# Patient Record
Sex: Female | Born: 1990
Health system: Southern US, Community
[De-identification: ages and names within clinical notes are randomized; demographics above are authoritative.]

## PROBLEM LIST (undated history)

## (undated) ENCOUNTER — Inpatient Hospital Stay (HOSPITAL_COMMUNITY): Payer: 59

## (undated) DIAGNOSIS — F329 Major depressive disorder, single episode, unspecified: Secondary | ICD-10-CM

## (undated) DIAGNOSIS — G47419 Narcolepsy without cataplexy: Secondary | ICD-10-CM

## (undated) DIAGNOSIS — J45909 Unspecified asthma, uncomplicated: Secondary | ICD-10-CM

## (undated) DIAGNOSIS — G4733 Obstructive sleep apnea (adult) (pediatric): Secondary | ICD-10-CM

## (undated) DIAGNOSIS — C73 Malignant neoplasm of thyroid gland: Secondary | ICD-10-CM

## (undated) DIAGNOSIS — F319 Bipolar disorder, unspecified: Secondary | ICD-10-CM

## (undated) DIAGNOSIS — F32A Depression, unspecified: Secondary | ICD-10-CM

## (undated) DIAGNOSIS — G4710 Hypersomnia, unspecified: Secondary | ICD-10-CM

## (undated) DIAGNOSIS — F419 Anxiety disorder, unspecified: Secondary | ICD-10-CM

## (undated) DIAGNOSIS — J302 Other seasonal allergic rhinitis: Secondary | ICD-10-CM

## (undated) HISTORY — DX: Narcolepsy without cataplexy: G47.419

## (undated) HISTORY — PX: THYROID SURGERY: SHX805

## (undated) HISTORY — DX: Unspecified asthma, uncomplicated: J45.909

## (undated) HISTORY — DX: Malignant neoplasm of thyroid gland: C73

## (undated) HISTORY — DX: Hypersomnia, unspecified: G47.10

## (undated) HISTORY — DX: Bipolar disorder, unspecified: F31.9

## (undated) HISTORY — DX: Obstructive sleep apnea (adult) (pediatric): G47.33

## (undated) HISTORY — DX: Other seasonal allergic rhinitis: J30.2

---
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
## 2008-04-28 ENCOUNTER — Emergency Department (HOSPITAL_COMMUNITY): Admission: EM | Admit: 2008-04-28 | Discharge: 2008-04-28 | Payer: Self-pay | Admitting: Emergency Medicine

## 2009-05-23 ENCOUNTER — Encounter: Admission: RE | Admit: 2009-05-23 | Discharge: 2009-05-23 | Payer: Self-pay | Admitting: Family Medicine

## 2009-11-16 ENCOUNTER — Encounter: Admission: RE | Admit: 2009-11-16 | Discharge: 2009-11-16 | Payer: Self-pay

## 2009-12-01 HISTORY — PX: BREAST SURGERY: SHX581

## 2009-12-14 ENCOUNTER — Ambulatory Visit (HOSPITAL_BASED_OUTPATIENT_CLINIC_OR_DEPARTMENT_OTHER): Admission: RE | Admit: 2009-12-14 | Discharge: 2009-12-14 | Payer: Self-pay | Admitting: Surgery

## 2010-11-16 ENCOUNTER — Inpatient Hospital Stay (INDEPENDENT_AMBULATORY_CARE_PROVIDER_SITE_OTHER)
Admission: RE | Admit: 2010-11-16 | Discharge: 2010-11-16 | Disposition: A | Discharge status: Home / Self Care | Payer: 59 | Source: Ambulatory Visit | Attending: Family Medicine | Admitting: Family Medicine

## 2010-11-16 DIAGNOSIS — J45909 Unspecified asthma, uncomplicated: Secondary | ICD-10-CM

## 2012-03-03 DIAGNOSIS — E890 Postprocedural hypothyroidism: Secondary | ICD-10-CM | POA: Insufficient documentation

## 2012-09-27 ENCOUNTER — Other Ambulatory Visit (HOSPITAL_COMMUNITY)
Admission: RE | Admit: 2012-09-27 | Discharge: 2012-09-27 | Disposition: A | Discharge status: Home / Self Care | Payer: 59 | Source: Ambulatory Visit | Attending: Family Medicine | Admitting: Family Medicine

## 2012-09-27 ENCOUNTER — Other Ambulatory Visit: Payer: Self-pay | Admitting: Family Medicine

## 2012-09-27 DIAGNOSIS — Z124 Encounter for screening for malignant neoplasm of cervix: Secondary | ICD-10-CM | POA: Insufficient documentation

## 2012-09-27 DIAGNOSIS — Z1151 Encounter for screening for human papillomavirus (HPV): Secondary | ICD-10-CM | POA: Insufficient documentation

## 2012-09-27 DIAGNOSIS — Z113 Encounter for screening for infections with a predominantly sexual mode of transmission: Secondary | ICD-10-CM | POA: Insufficient documentation

## 2012-09-28 ENCOUNTER — Other Ambulatory Visit: Payer: Self-pay | Admitting: Family Medicine

## 2012-09-28 DIAGNOSIS — E049 Nontoxic goiter, unspecified: Secondary | ICD-10-CM

## 2012-09-29 ENCOUNTER — Other Ambulatory Visit: Payer: 59

## 2012-09-30 ENCOUNTER — Ambulatory Visit
Admission: RE | Admit: 2012-09-30 | Discharge: 2012-09-30 | Disposition: A | Discharge status: Home / Self Care | Payer: 59 | Source: Ambulatory Visit | Attending: Family Medicine | Admitting: Family Medicine

## 2012-09-30 DIAGNOSIS — E049 Nontoxic goiter, unspecified: Secondary | ICD-10-CM

## 2012-10-05 ENCOUNTER — Other Ambulatory Visit: Payer: Self-pay | Admitting: Family Medicine

## 2012-10-05 DIAGNOSIS — E041 Nontoxic single thyroid nodule: Secondary | ICD-10-CM

## 2012-10-06 ENCOUNTER — Ambulatory Visit
Admission: RE | Admit: 2012-10-06 | Discharge: 2012-10-06 | Disposition: A | Discharge status: Home / Self Care | Payer: 59 | Source: Ambulatory Visit | Attending: Family Medicine | Admitting: Family Medicine

## 2012-10-06 ENCOUNTER — Other Ambulatory Visit (HOSPITAL_COMMUNITY)
Admission: RE | Admit: 2012-10-06 | Discharge: 2012-10-06 | Disposition: A | Discharge status: Home / Self Care | Payer: 59 | Source: Ambulatory Visit | Attending: Interventional Radiology | Admitting: Interventional Radiology

## 2012-10-06 DIAGNOSIS — E041 Nontoxic single thyroid nodule: Secondary | ICD-10-CM | POA: Insufficient documentation

## 2012-10-22 ENCOUNTER — Encounter (INDEPENDENT_AMBULATORY_CARE_PROVIDER_SITE_OTHER): Payer: Self-pay

## 2012-10-25 ENCOUNTER — Ambulatory Visit (INDEPENDENT_AMBULATORY_CARE_PROVIDER_SITE_OTHER): Payer: Commercial Managed Care - PPO | Admitting: Surgery

## 2012-10-25 ENCOUNTER — Encounter (INDEPENDENT_AMBULATORY_CARE_PROVIDER_SITE_OTHER): Payer: Self-pay | Admitting: Surgery

## 2012-10-25 VITALS — BP 120/78 | HR 68 | Temp 98.2°F | Resp 14 | Ht 67.0 in | Wt 211.6 lb

## 2012-10-25 DIAGNOSIS — D449 Neoplasm of uncertain behavior of unspecified endocrine gland: Secondary | ICD-10-CM

## 2012-10-25 DIAGNOSIS — D440 Neoplasm of uncertain behavior of thyroid gland: Secondary | ICD-10-CM

## 2012-10-25 NOTE — Patient Instructions (Signed)

## 2012-10-25 NOTE — Progress Notes (Signed)
General Surgery [facility]  Chief Complaint  Patient presents with  . 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 Beverley Fiedler, [place] @ [employer]    HISTORY: Patient is a 22 year old female employed at [employer] at the [employer] facility.  She was found on routine physical examination by her primary care physician to have a thyroid mass. Patient underwent a thyroid ultrasound which shows a 6.6 cm inhomogeneous mass occupying a large portion of the right thyroid lobe. Left thyroid lobe was essentially normal in size without nodules. 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 Patient is referred for consideration for surgical resection.  Patient has no prior history of thyroid disease. She has never been on thyroid medication. She has had no prior head or neck surgery.  The patient's mother has hypothyroidism. There is no family history of other endocrine neoplasm. There is no family history of thyroid malignancy.  Patient has noted mild dysphagia of both solids and liquids over the past 3 months. She notes mild dyspnea on exertion.  Past Medical History  Diagnosis Date  . Bipolar 1 disorder   . Asthma     Current Outpatient Prescriptions  Medication Sig Dispense Refill  . albuterol (PROVENTIL HFA;VENTOLIN HFA) 108 (90 BASE) MCG/ACT inhaler Inhale 2 puffs into the lungs every 6 (six) hours as needed for wheezing.      Marland Kitchen ALPRAZolam (XANAX) 1 MG tablet Take 1 mg by mouth 3 (three) times daily as needed for sleep.      . cetirizine (ZYRTEC) 10 MG tablet Take 10 mg by mouth daily.      Marland Kitchen etonogestrel (NEXPLANON) 68 MG IMPL implant Inject 1 each into the skin once.      . lamoTRIgine (LAMICTAL) 25 MG tablet Take 100 mg by mouth daily.      . methylphenidate (RITALIN LA) 20 MG 24 hr capsule Take 20 mg by mouth every morning.      . methylphenidate (RITALIN) 10  MG tablet Take 10 mg by mouth 2 (two) times daily.       No current facility-administered medications for this visit.    No Known Allergies  History reviewed. No pertinent family history.  History   Social History  . Marital Status: Single    Spouse Name: N/A    Number of Children: N/A  . Years of Education: N/A   Social History Main Topics  . Smoking status: Former Smoker    Quit date: 04/03/2012  . Smokeless tobacco: Never Used  . Alcohol Use: 0.6 oz/week    1 Glasses of wine per week     Comment: weekly  . Drug Use: No  . Sexual Activity: None   Other Topics Concern  . None   Social History Narrative  . None    REVIEW OF SYSTEMS - PERTINENT POSITIVES ONLY: Denies tremor. Denies palpitations. Mild dysphagia of 3 months duration. Mild dyspnea.  EXAM: Filed Vitals:   10/25/12 1554  BP: 120/78  Pulse: 68  Temp: 98.2 F (36.8 C)  Resp: 14    HEENT: normocephalic; pupils equal and reactive; sclerae clear; dentition good; mucous membranes moist NECK:  Dominant mass occupying the right thyroid lobe measuring at least 6 cm in greatest dimension, smooth, mobile with swallowing, nontender; left thyroid lobe without palpable abnormality; asymmetric on extension; no palpable anterior or posterior cervical lymphadenopathy; no supraclavicular masses; no tenderness CHEST: clear  to auscultation bilaterally without rales, rhonchi, or wheezes CARDIAC: regular rate and rhythm without significant murmur; peripheral pulses are full EXT:  non-tender without edema; no deformity NEURO: no gross focal deficits; no sign of tremor   LABORATORY RESULTS: See [facility] (CHL-Epic) for most recent results  RADIOLOGY RESULTS: See [facility] (CHL-Epic) for most recent results  IMPRESSION: Right thyroid mass, 6.6 cm, with cytopathology showing Hurthle cells.  PLAN: I had a lengthy discussion with the patient about the above findings. We reviewed her studies. We discussed  the options for management. I have recommended a right thyroid lobectomy for definitive diagnosis. Patient is quite concerned that this is malignant and desires a total thyroidectomy. She does not want to have to come back for a second procedure. We discussed the increased risk of total thyroidectomy versus right thyroid lobectomy. We specifically discussed the risk of injury to recurrent laryngeal nerves and parathyroid glands. We discussed the need for lifelong thyroid hormone replacement in the event of total thyroidectomy. She understands and wishes to proceed with total thyroidectomy in the near future. We will make arrangements at a time convenient for the patient.  The risks and benefits of the procedure have been discussed at length with the patient.  The patient understands the proposed procedure, potential alternative treatments, and the course of recovery to be expected.  All of the patient's questions have been answered at this time.  The patient wishes to proceed with surgery.  Velora Heckler, MD, FACS General & Endocrine Surgery [facility]  Primary Care Physician: Beverley Fiedler, MD

## 2012-10-26 ENCOUNTER — Telehealth (INDEPENDENT_AMBULATORY_CARE_PROVIDER_SITE_OTHER): Payer: Self-pay | Admitting: Surgery

## 2012-10-26 NOTE — Telephone Encounter (Signed)
Called patient about scheduling, gave financial responsibilities, patient will call back, orders in pending.

## 2012-11-02 ENCOUNTER — Ambulatory Visit (INDEPENDENT_AMBULATORY_CARE_PROVIDER_SITE_OTHER): Payer: Commercial Managed Care - PPO | Admitting: Surgery

## 2012-11-02 ENCOUNTER — Ambulatory Visit (INDEPENDENT_AMBULATORY_CARE_PROVIDER_SITE_OTHER): Payer: Self-pay | Admitting: Surgery

## 2012-11-17 DIAGNOSIS — C73 Malignant neoplasm of thyroid gland: Secondary | ICD-10-CM | POA: Insufficient documentation

## 2012-11-24 DIAGNOSIS — G47419 Narcolepsy without cataplexy: Secondary | ICD-10-CM | POA: Insufficient documentation

## 2012-11-24 DIAGNOSIS — J45909 Unspecified asthma, uncomplicated: Secondary | ICD-10-CM | POA: Insufficient documentation

## 2012-12-20 DIAGNOSIS — L501 Idiopathic urticaria: Secondary | ICD-10-CM | POA: Insufficient documentation

## 2012-12-20 HISTORY — PX: THYROIDECTOMY: SHX17

## 2013-01-06 ENCOUNTER — Other Ambulatory Visit: Payer: Self-pay

## 2013-02-28 ENCOUNTER — Emergency Department (HOSPITAL_COMMUNITY): Payer: 59

## 2013-02-28 ENCOUNTER — Emergency Department (HOSPITAL_COMMUNITY)
Admission: EM | Admit: 2013-02-28 | Discharge: 2013-02-28 | Disposition: A | Discharge status: Home / Self Care | Payer: 59 | Attending: Emergency Medicine | Admitting: Emergency Medicine

## 2013-02-28 ENCOUNTER — Encounter (HOSPITAL_COMMUNITY): Payer: Self-pay | Admitting: Emergency Medicine

## 2013-02-28 DIAGNOSIS — R079 Chest pain, unspecified: Secondary | ICD-10-CM

## 2013-02-28 DIAGNOSIS — Z79899 Other long term (current) drug therapy: Secondary | ICD-10-CM | POA: Insufficient documentation

## 2013-02-28 DIAGNOSIS — Z85850 Personal history of malignant neoplasm of thyroid: Secondary | ICD-10-CM | POA: Insufficient documentation

## 2013-02-28 DIAGNOSIS — Z923 Personal history of irradiation: Secondary | ICD-10-CM | POA: Insufficient documentation

## 2013-02-28 DIAGNOSIS — J45901 Unspecified asthma with (acute) exacerbation: Secondary | ICD-10-CM | POA: Insufficient documentation

## 2013-02-28 DIAGNOSIS — Z87891 Personal history of nicotine dependence: Secondary | ICD-10-CM | POA: Insufficient documentation

## 2013-02-28 DIAGNOSIS — F319 Bipolar disorder, unspecified: Secondary | ICD-10-CM | POA: Insufficient documentation

## 2013-02-28 DIAGNOSIS — R072 Precordial pain: Secondary | ICD-10-CM | POA: Insufficient documentation

## 2013-02-28 HISTORY — DX: Malignant neoplasm of thyroid gland: C73

## 2013-02-28 LAB — CBC WITH DIFFERENTIAL/PLATELET
Basophils Absolute: 0.1 10*3/uL (ref 0.0–0.1)
Basophils Relative: 0 % (ref 0–1)
Eosinophils Absolute: 0.7 10*3/uL (ref 0.0–0.7)
Hemoglobin: 12.8 g/dL (ref 12.0–15.0)
Lymphs Abs: 2 10*3/uL (ref 0.7–4.0)
MCH: 29.3 pg (ref 26.0–34.0)
MCHC: 34 g/dL (ref 30.0–36.0)
Monocytes Absolute: 0.8 10*3/uL (ref 0.1–1.0)
Monocytes Relative: 6 % (ref 3–12)
Neutro Abs: 10.1 10*3/uL — ABNORMAL HIGH (ref 1.7–7.7)
Neutrophils Relative %: 74 % (ref 43–77)
RBC: 4.37 MIL/uL (ref 3.87–5.11)
RDW: 12.9 % (ref 11.5–15.5)

## 2013-02-28 LAB — BASIC METABOLIC PANEL
CO2: 26 mEq/L (ref 19–32)
Calcium: 9.6 mg/dL (ref 8.4–10.5)
GFR calc Af Amer: >90 mL/min (ref >90)
GFR calc non Af Amer: >90 mL/min (ref >90)
Glucose, Bld: 88 mg/dL (ref 70–99)
Potassium: 4.2 mEq/L (ref 3.5–5.1)

## 2013-02-28 LAB — POCT I-STAT TROPONIN I: Troponin i, poc: 0 ng/mL (ref 0.00–0.08)

## 2013-02-28 MED ORDER — OMEPRAZOLE 20 MG PO CPDR: 20.0000 mg | DELAYED_RELEASE_CAPSULE | Freq: Every day | ORAL | Status: DC

## 2013-02-28 MED ORDER — HYDROCODONE-ACETAMINOPHEN 5-325 MG PO TABS: 1.0000 | ORAL_TABLET | ORAL | Status: DC | PRN

## 2013-02-28 NOTE — ED Notes (Signed)
Pt states she started having chest pain since saturday. Pt states it is hard to catch a full breath. Denies nausea, weakness or radiation. States pain is central chest. Was given radioactive iodine on 12/19.

## 2013-02-28 NOTE — ED Provider Notes (Signed)
CSN: [id number]     Arrival date & time 02/28/13  1608 History   First MD Initiated Contact with Patient 02/28/13 1621     Chief Complaint  Patient presents with  . Chest Pain    Patient is a 22 y.o. female presenting with chest pain. The history is provided by the patient.  Chest Pain Pain location:  Substernal area Pain quality: tightness   Pain radiates to:  Does not radiate Pain severity:  Moderate Onset quality:  Gradual Duration:  3 days Timing:  Constant Chronicity:  New Relieved by:  Nothing Worsened by:  Deep breathing Ineffective treatments:  None tried Associated symptoms: shortness of breath   Associated symptoms: no abdominal pain, no anorexia, no cough, no fever, no lower extremity edema and not vomiting   Risk factors: smoking (quit in frebruary)   Risk factors: no hypertension and no prior DVT/PE   Risk factors comment:  History of thyroid ca, last radiation treatment friday the 19th  the patient called her primary Dr. Dr. symptoms. She was told him he better for her to come to emergency room to be evaluated.  Past Medical History  Diagnosis Date  . Bipolar 1 disorder   . Asthma   . Thyroid ca    Past Surgical History  Procedure Laterality Date  . Rhinoplasty  2009  . Tonsilectomy, adenoidectomy, bilateral myringotomy and tubes  2009  . Breast surgery  12/2009    lump removed left breast   History reviewed. No pertinent family history. History  Substance Use Topics  . Smoking status: Former Smoker    Quit date: 04/03/2012  . Smokeless tobacco: Never Used  . Alcohol Use: 0.6 oz/week    1 Glasses of wine per week     Comment: weekly   OB History   Grav Para Term Preterm Abortions TAB SAB Ect Mult Living                 Review of Systems  Constitutional: Negative for fever.  Respiratory: Positive for shortness of breath. Negative for cough.   Cardiovascular: Positive for chest pain.  Gastrointestinal: Negative for vomiting, abdominal pain and  anorexia.  All other systems reviewed and are negative.    Allergies  Review of patient's allergies indicates no known allergies.  Home Medications   Current Outpatient Rx  Name  Route  Sig  Dispense  Refill  . albuterol (PROVENTIL HFA;VENTOLIN HFA) 108 (90 BASE) MCG/ACT inhaler   Inhalation   Inhale 2 puffs into the lungs every 6 (six) hours as needed for wheezing.         Marland Kitchen ALPRAZolam (XANAX) 1 MG tablet   Oral   Take 1 mg by mouth 3 (three) times daily as needed for anxiety.          Marland Kitchen escitalopram (LEXAPRO) 10 MG tablet   Oral   Take 10 mg by mouth daily.         Marland Kitchen etonogestrel (NEXPLANON) 68 MG IMPL implant   Subcutaneous   Inject 1 each into the skin once.         Marland Kitchen levothyroxine (SYNTHROID, LEVOTHROID) 200 MCG tablet   Oral   Take 200 mcg by mouth daily before breakfast.         . loratadine (CLARITIN) 10 MG tablet   Oral   Take 10 mg by mouth daily.         . methylphenidate (RITALIN LA) 20 MG 24 hr capsule   Oral  Take 20 mg by mouth every morning.         . methylphenidate (RITALIN) 10 MG tablet   Oral   Take 10 mg by mouth as needed (trouble focussing).          Marland Kitchen HYDROcodone-acetaminophen (NORCO/VICODIN) 5-325 MG per tablet   Oral   Take 1-2 tablets by mouth every 4 (four) hours as needed.   16 tablet   0   . omeprazole (PRILOSEC) 20 MG capsule   Oral   Take 1 capsule (20 mg total) by mouth daily.   14 capsule   0    BP 123/70  Pulse 93  Temp(Src) 98.3 F (36.8 C) (Oral)  Resp 17  SpO2 98%  LMP 11/30/2011 Physical Exam  Nursing note and vitals reviewed. Constitutional: She appears well-developed and well-nourished. No distress.  HENT:  Head: Normocephalic and atraumatic.  Right Ear: External ear normal.  Left Ear: External ear normal.  Eyes: Conjunctivae are normal. Right eye exhibits no discharge. Left eye exhibits no discharge. No scleral icterus.  Neck: Neck supple. No tracheal deviation present. No thyromegaly  present.  Cardiovascular: Normal rate, regular rhythm and intact distal pulses.   Pulmonary/Chest: Effort normal and breath sounds normal. No stridor. No respiratory distress. She has no wheezes. She has no rales.  Abdominal: Soft. Bowel sounds are normal. She exhibits no distension. There is no tenderness. There is no rebound and no guarding.  Musculoskeletal: She exhibits no edema and no tenderness.  Neurological: She is alert. She has normal strength. No sensory deficit. Cranial nerve deficit:  no gross defecits noted. She exhibits normal muscle tone. She displays no seizure activity. Coordination normal.  Skin: Skin is warm and dry. No rash noted.  Psychiatric: She has a normal mood and affect.    ED Course  Procedures  Wells score for pulmonary embolism: 1, low risk category Labs Review Labs Reviewed  CBC WITH DIFFERENTIAL - Abnormal; Notable for the following:    WBC 13.7 (*)    Neutro Abs 10.1 (*)    All other components within normal limits  D-DIMER, QUANTITATIVE  BASIC METABOLIC PANEL  CBC WITH DIFFERENTIAL  POCT I-STAT TROPONIN I   Imaging Review Dg Chest 2 View  02/28/2013   CLINICAL DATA:  Chest pain for 2 days, history of asthma and thyroid cancer  EXAM: CHEST  2 VIEW  COMPARISON:  None.  FINDINGS: The heart size and mediastinal contours are within normal limits. Both lungs are clear. The visualized skeletal structures are unremarkable.  IMPRESSION: No active cardiopulmonary disease.   Electronically Signed   By: Elige Ko   On: 02/28/2013 17:34    EKG Interpretation    Date/Time:  Monday February 28 2013 16:26:18 EST Ventricular Rate:  95 PR Interval:  134 QRS Duration: 104 QT Interval:  357 QTC Calculation: 449 R Axis:   27 Text Interpretation:  Sinus rhythm Baseline wander in lead(s) II III aVR aVL aVF V1 V4 V5 V6 No previous tracing Confirmed by Eldean Nanna  MD-J, [facility] ([id number]) on 02/28/2013 4:39:46 PM            MDM   1. Chest pain     No signs of  myocarditis, pericarditis.  Low risk for PE with negative d dimer.  Could be related to esophageal irritation from her radiation treatment.  Will try ppi.  Follow up with PCP    Celene Kras, MD 02/28/13 2155

## 2014-03-03 DIAGNOSIS — R638 Other symptoms and signs concerning food and fluid intake: Secondary | ICD-10-CM | POA: Insufficient documentation

## 2014-03-30 ENCOUNTER — Encounter (HOSPITAL_COMMUNITY): Payer: Self-pay | Admitting: *Deleted

## 2014-03-30 ENCOUNTER — Emergency Department (HOSPITAL_COMMUNITY)
Admission: EM | Admit: 2014-03-30 | Discharge: 2014-03-30 | Disposition: A | Discharge status: Home / Self Care | Payer: PRIVATE HEALTH INSURANCE | Attending: Emergency Medicine | Admitting: Emergency Medicine

## 2014-03-30 DIAGNOSIS — Z85850 Personal history of malignant neoplasm of thyroid: Secondary | ICD-10-CM | POA: Insufficient documentation

## 2014-03-30 DIAGNOSIS — Y9389 Activity, other specified: Secondary | ICD-10-CM | POA: Diagnosis not present

## 2014-03-30 DIAGNOSIS — Y9289 Other specified places as the place of occurrence of the external cause: Secondary | ICD-10-CM | POA: Diagnosis not present

## 2014-03-30 DIAGNOSIS — Y990 Civilian activity done for income or pay: Secondary | ICD-10-CM | POA: Insufficient documentation

## 2014-03-30 DIAGNOSIS — F319 Bipolar disorder, unspecified: Secondary | ICD-10-CM | POA: Diagnosis not present

## 2014-03-30 DIAGNOSIS — Z79899 Other long term (current) drug therapy: Secondary | ICD-10-CM | POA: Insufficient documentation

## 2014-03-30 DIAGNOSIS — W2203XA Walked into furniture, initial encounter: Secondary | ICD-10-CM | POA: Diagnosis not present

## 2014-03-30 DIAGNOSIS — S0990XA Unspecified injury of head, initial encounter: Secondary | ICD-10-CM | POA: Diagnosis not present

## 2014-03-30 DIAGNOSIS — Z87891 Personal history of nicotine dependence: Secondary | ICD-10-CM | POA: Diagnosis not present

## 2014-03-30 DIAGNOSIS — S0001XA Abrasion of scalp, initial encounter: Secondary | ICD-10-CM | POA: Diagnosis not present

## 2014-03-30 DIAGNOSIS — J45909 Unspecified asthma, uncomplicated: Secondary | ICD-10-CM | POA: Diagnosis not present

## 2014-03-30 MED ORDER — IBUPROFEN 800 MG PO TABS
800.0000 mg | ORAL_TABLET | Freq: Once | ORAL | Status: AC
  Administered 2014-03-30: 800 mg via ORAL
  Filled 2014-03-30: qty 1

## 2014-03-30 MED ORDER — IBUPROFEN 800 MG PO TABS: 800.0000 mg | ORAL_TABLET | Freq: Three times a day (TID) | ORAL | Status: DC | PRN

## 2014-03-30 NOTE — ED Notes (Signed)
See triage note.

## 2014-03-30 NOTE — ED Notes (Signed)
Pt reports hitting the top of her head on a cabinet at work.  Pt reports head pain. Pt is A&Ox 4.

## 2014-03-30 NOTE — ED Provider Notes (Signed)
CSN: [id number]     Arrival date & time 03/30/14  1508 History  This chart was scribed for non-physician practitioner Domenic Moras, working with Blanchie Dessert, MD by Donato Schultz, ED Scribe. This patient was seen in room WTR8/WTR8 and the patient's care was started at 3:22 PM.   No chief complaint on file.  The history is provided by the patient. No language interpreter was used.   HPI Comments: Sophia West is a 24 y.o. female who presents to the Emergency Department complaining of a headache that started today at work after she hit her head on a corner of a window.  She denies LOC at the time of the injury.  She has not taken anything for her headache.   Past Medical History  Diagnosis Date  . Bipolar 1 disorder   . Asthma   . Thyroid ca    Past Surgical History  Procedure Laterality Date  . Rhinoplasty  2009  . Tonsilectomy, adenoidectomy, bilateral myringotomy and tubes  2009  . Breast surgery  12/2009    lump removed left breast   No family history on file. History  Substance Use Topics  . Smoking status: Former Smoker    Quit date: 04/03/2012  . Smokeless tobacco: Never Used  . Alcohol Use: 0.6 oz/week    1 Glasses of wine per week     Comment: weekly   OB History    No data available     Review of Systems  Neurological: Positive for headaches. Negative for syncope.  All other systems reviewed and are negative.     Allergies  Review of patient's allergies indicates no known allergies.  Home Medications   Prior to Admission medications   Medication Sig Start Date End Date Taking? Authorizing Provider  albuterol (PROVENTIL HFA;VENTOLIN HFA) 108 (90 BASE) MCG/ACT inhaler Inhale 2 puffs into the lungs every 6 (six) hours as needed for wheezing.    Historical Provider, MD  ALPRAZolam Duanne Moron) 1 MG tablet Take 1 mg by mouth 3 (three) times daily as needed for anxiety.     Historical Provider, MD  escitalopram (LEXAPRO) 10 MG tablet Take 10 mg by mouth daily.     Historical Provider, MD  etonogestrel (NEXPLANON) 68 MG IMPL implant Inject 1 each into the skin once.    Historical Provider, MD  HYDROcodone-acetaminophen (NORCO/VICODIN) 5-325 MG per tablet Take 1-2 tablets by mouth every 4 (four) hours as needed. 02/28/13   Dorie Rank, MD  levothyroxine (SYNTHROID, LEVOTHROID) 200 MCG tablet Take 200 mcg by mouth daily before breakfast.    Historical Provider, MD  loratadine (CLARITIN) 10 MG tablet Take 10 mg by mouth daily.    Historical Provider, MD  methylphenidate (RITALIN LA) 20 MG 24 hr capsule Take 20 mg by mouth every morning.    Historical Provider, MD  methylphenidate (RITALIN) 10 MG tablet Take 10 mg by mouth as needed (trouble focussing).     Historical Provider, MD  omeprazole (PRILOSEC) 20 MG capsule Take 1 capsule (20 mg total) by mouth daily. 02/28/13   Dorie Rank, MD   There were no vitals taken for this visit.   Physical Exam  Constitutional: She is oriented to person, place, and time. She appears well-developed and well-nourished.  HENT:  Head: Normocephalic. Head is with abrasion.  Superficial skin abrasion noted to the vertex of the scalp with tenderness to palpation.  No deformity.  No crepitus.    Eyes: EOM are normal.  Neck: Normal range of motion.  Cardiovascular: Normal rate.   Pulmonary/Chest: Effort normal.  Musculoskeletal: Normal range of motion.  Neurological: She is alert and oriented to person, place, and time.  Skin: Skin is warm and dry.  Psychiatric: She has a normal mood and affect. Her behavior is normal.  Nursing note and vitals reviewed.   ED Course  Procedures (including critical care time)  COORDINATION OF CARE: 3:26 PM- minor head injury.  Doubt concussion or intracranial trauma.  Reassurance given.  Will prescribed 800mg  Ibuprofen and the patient agreed to the treatment plan.  Labs Review Labs Reviewed - No data to display  Imaging Review No results found.   EKG Interpretation None      MDM    Final diagnoses:  Minor head injury without loss of consciousness, initial encounter    BP 139/84 mmHg  Pulse 103  Temp(Src) 98.9 F (37.2 C) (Oral)  Resp 18  SpO2 100%  LMP 03/16/2014  I personally performed the services described in this documentation, which was scribed in my presence. The recorded information has been reviewed and is accurate.     Domenic Moras, PA-C 03/30/14 [place], MD 03/31/14 0002

## 2014-03-30 NOTE — Discharge Instructions (Signed)
Head Injury  You have a head injury. Headaches and throwing up (vomiting) are common after a head injury. It should be easy to wake up from sleeping. Sometimes you must stay in the hospital. Most problems happen within the first 24 hours. Side effects may occur up to 7-10 days after the injury.   WHAT ARE THE TYPES OF HEAD INJURIES?  Head injuries can be as minor as a bump. Some head injuries can be more severe. More severe head injuries include:  · A jarring injury to the brain (concussion).  · A bruise of the brain (contusion). This mean there is bleeding in the brain that can cause swelling.  · A cracked skull (skull fracture).  · Bleeding in the brain that collects, clots, and forms a bump (hematoma).  WHEN SHOULD I GET HELP RIGHT AWAY?   · You are confused or sleepy.  · You cannot be woken up.  · You feel sick to your stomach (nauseous) or keep throwing up (vomiting).  · Your dizziness or unsteadiness is getting worse.  · You have very bad, lasting headaches that are not helped by medicine. Take medicines only as told by your doctor.  · You cannot use your arms or legs like normal.  · You cannot walk.  · You notice changes in the black spots in the center of the colored part of your eye (pupil).  · You have clear or bloody fluid coming from your nose or ears.  · You have trouble seeing.  During the next 24 hours after the injury, you must stay with someone who can watch you. This person should get help right away (call 911 in the [place] if you start to shake and are not able to control it (have seizures), you pass out, or you are unable to wake up.  HOW CAN I PREVENT A HEAD INJURY IN THE FUTURE?  · Wear seat belts.  · Wear a helmet while bike riding and playing sports like football.  · Stay away from dangerous activities around the house.  WHEN CAN I RETURN TO NORMAL ACTIVITIES AND ATHLETICS?  See your doctor before doing these activities. You should not do normal activities or play contact sports until 1 week  after the following symptoms have stopped:  · Headache that does not go away.  · Dizziness.  · Poor attention.  · Confusion.  · Memory problems.  · Sickness to your stomach or throwing up.  · Tiredness.  · Fussiness.  · Bothered by bright lights or loud noises.  · Anxiousness or depression.  · Restless sleep.  MAKE SURE YOU:   · Understand these instructions.  · Will watch your condition.  · Will get help right away if you are not doing well or get worse.  Document Released: 01/31/2008 Document Revised: 07/04/2013 Document Reviewed: 10/25/2012  [employer]® Patient Information ©2015 [employer]. This information is not intended to replace advice given to you by your health care provider. Make sure you discuss any questions you have with your health care provider.

## 2014-10-20 ENCOUNTER — Other Ambulatory Visit: Payer: Self-pay

## 2014-10-23 LAB — CYTOLOGY - PAP

## 2015-03-13 MED FILL — SYNTHROID 175 MCG TABLET: 175 | 30 days supply | Qty: 30 | Fill #9

## 2015-03-15 ENCOUNTER — Encounter: Payer: Self-pay | Admitting: Pulmonary Disease

## 2015-03-15 ENCOUNTER — Ambulatory Visit (INDEPENDENT_AMBULATORY_CARE_PROVIDER_SITE_OTHER): Payer: 59 | Admitting: Pulmonary Disease

## 2015-03-15 VITALS — BP 126/88 | HR 104 | Ht 67.0 in | Wt 251.4 lb

## 2015-03-15 DIAGNOSIS — G4733 Obstructive sleep apnea (adult) (pediatric): Secondary | ICD-10-CM | POA: Diagnosis not present

## 2015-03-15 DIAGNOSIS — G4711 Idiopathic hypersomnia with long sleep time: Secondary | ICD-10-CM | POA: Diagnosis not present

## 2015-03-15 MED ORDER — METHYLPHENIDATE HCL 10 MG PO TABS: 10.0000 mg | ORAL_TABLET | ORAL | Status: DC | PRN

## 2015-03-15 MED ORDER — METHYLPHENIDATE HCL ER (LA) 20 MG PO CP24: 20.0000 mg | ORAL_CAPSULE | ORAL | Status: DC

## 2015-03-15 MED FILL — METHYLPHENIDATE ER 20 MG CA: 20 | 30 days supply | Qty: 30 | Fill #0

## 2015-03-15 MED FILL — ESCITALOPRAM 10 MG TABLET: 10 | 30 days supply | Qty: 30 | Fill #4

## 2015-03-15 NOTE — Patient Instructions (Signed)
Will get copy for sleep study  Will arrange for CPAP set up  Follow up in 2 months after CPAP set up with Dr. Halford Chessman or [facility]

## 2015-03-15 NOTE — Progress Notes (Deleted)
   Subjective:    Patient ID: Sophia West, female    DOB: 1990-10-18, 25 y.o.   MRN: [id number]  HPI    Review of Systems  Constitutional: Positive for unexpected weight change. Negative for fever.  HENT: Positive for sneezing and trouble swallowing. Negative for congestion, dental problem, ear pain, nosebleeds, postnasal drip, rhinorrhea, sinus pressure and sore throat.   Eyes: Negative for redness and itching.  Respiratory: Negative for cough, chest tightness, shortness of breath and wheezing.   Cardiovascular: Negative for palpitations and leg swelling.  Gastrointestinal: Negative for nausea and vomiting.  Genitourinary: Negative for dysuria.  Musculoskeletal: Negative for joint swelling.  Skin: Negative for rash ( itching).  Neurological: Negative for headaches.  Hematological: Does not bruise/bleed easily.  Psychiatric/Behavioral: Negative for dysphoric mood. The patient is not nervous/anxious.        Objective:   Physical Exam        Assessment & Plan:

## 2015-03-15 NOTE — Progress Notes (Addendum)
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  Past Surgical History Sophia West  has past surgical history that includes Rhinoplasty (2009); Tonsilectomy, adenoidectomy, bilateral myringotomy and tubes (2009); Breast surgery (12/2009); and Thyroidectomy (12/20/2012).  Current Outpatient Prescriptions on File Prior to Visit  Medication Sig  . albuterol (PROVENTIL HFA;VENTOLIN HFA) 108 (90 BASE) MCG/ACT inhaler Inhale 2 puffs into the lungs every 6 (six) hours as needed for wheezing.  Marland Kitchen ALPRAZolam (XANAX) 1 MG tablet Take 1 mg by mouth 3 (three) times daily as needed for anxiety.   Marland Kitchen escitalopram (LEXAPRO) 10 MG tablet Take 10 mg by mouth daily.  Marland Kitchen etonogestrel (NEXPLANON) 68 MG IMPL implant Inject 1 each into the skin once.   No current facility-administered medications on file prior to visit.    No Known Allergies  Family History Her family history includes Asthma in her brother; Cancer in her other.  Social History Sophia West  reports that Sophia West quit smoking about 2 years ago. Her smoking use included Cigarettes. Sophia West has a 8 pack-year smoking history. Sophia West has never used smokeless tobacco. Sophia West reports that Sophia West drinks about 0.6 oz of alcohol per week. Sophia West reports that Sophia West does not use illicit drugs.  Review of systems Review of Systems  Constitutional: Positive for unexpected weight change. Negative for fever.  HENT: Positive for sneezing and trouble swallowing. Negative for congestion, dental problem, ear pain, nosebleeds, postnasal drip, rhinorrhea, sinus pressure and sore throat.   Eyes: Negative for redness and itching.  Respiratory: Negative for cough, chest tightness, shortness of breath and wheezing.   Cardiovascular: Negative for palpitations and leg swelling.  Gastrointestinal: Negative for nausea and vomiting.   Genitourinary: Negative for dysuria.  Musculoskeletal: Negative for joint swelling.  Skin: Negative for rash ( itching).  Neurological: Negative for headaches.  Hematological: Does not bruise/bleed easily.  Psychiatric/Behavioral: Negative for dysphoric mood. The patient is not nervous/anxious.     Chief Complaint  Patient presents with  . Sleep Consult    Self referral for Narcolepsy. NPSG 2009, HST 2015 -[name] in [place], [place]. Epworth Score: 14    Tests: PSG >> AHI 5.2, SpO2 low 88%  Vital signs BP 126/88 mmHg  Pulse 104  Ht 5\' 7"  (1.702 m)  Wt 251 lb 6.4 oz (114.034 kg)  BMI 39.37 kg/m2  SpO2 99%  History of Present Illness Sophia West is a 25 y.o. female for evaluation of sleep problems.  Sophia West was being seen by sleep specialist in [place], [place].  Sophia West was diagnosed with hypersomnia.  Sophia West was told Sophia West has narcolepsy.  Sophia West has been on therapy with ritalin.  Sophia West does not recall trying other stimulant medications before starting ritalin.  Sophia West needs to establish with new sleep physician in [place].  Sophia West still has trouble feeling sleepy during the day.  Sophia West does also snore.  Sophia West had a home sleep study which showed AHI of 5.2 and SaO2 low of 88%.  Sophia West did not start CPAP or oral appliance.  Focus was on her potential narcolepsy and weight loss >> that is why Sophia West didn't try treating her sleep apnea at the time of her sleep study.   Sophia West goes to sleep at 10 pm.  Sophia West falls asleep after 5 minutes.  Sophia West wakes up 1 or 2 times to use the bathroom.  Sophia West gets out of bed at  6 am.  Sophia West feels tired in the morning.  Sophia West denies morning headache.  Sophia West does not use anything to help her fall sleep or stay awake.  Sophia West denies sleep walking, sleep talking, bruxism, or nightmares.  There is no history of restless legs.  Sophia West denies sleep hallucinations, sleep paralysis, or cataplexy.  The Epworth score is 14 out of 24.   Physical Exam:  General - No distress ENT - No sinus  tenderness, no oral exudate, no LAN, no thyromegaly, TM clear, pupils equal/reactive Cardiac - s1s2 regular, no murmur, pulses symmetric Chest - No wheeze/rales/dullness, good air entry, normal respiratory excursion Back - No focal tenderness Abd - Soft, non-tender, no organomegaly, + bowel sounds Ext - No edema Neuro - Normal strength, cranial nerves intact Skin - No rashes Psych - Normal mood, and behavior  Discussion: Sophia West has snoring, sleep disurption, and persistent daytime sleepiness.  Sophia West has hx of hypersomnia and possibly narcolepsy.  Recent home sleep study showed mild sleep apnea.  Assessment/plan:  Obstructive sleep apnea. Plan: - would like to treat her sleep apnea first before assessing whether Sophia West needs additional medical therapy for hypersomnia - will get copy of sleep study from [place], [place]  Obesity. Plan: - discussed importance of weight loss  Hypersomnia with possible narcolepsy. Plan: - continue ritalin >> I have written scripts  - discussed proper sleep hygiene - advised to take brief, scheduled naps during the day as able - safe driving practices discussed   Patient Instructions  Will get copy for sleep study  Will arrange for CPAP set up  Follow up in 2 months after CPAP set up with Dr. Halford Chessman or Tammy Parrett     Chesley Mires, M.D. Pager [phone]

## 2015-03-16 ENCOUNTER — Telehealth: Payer: Self-pay | Admitting: Pulmonary Disease

## 2015-03-16 MED FILL — METHYLPHENIDATE 10 MG TAB: 10 | 30 days supply | Qty: 30 | Fill #0

## 2015-03-16 NOTE — Telephone Encounter (Signed)
Spoke with Gaspar Bidding at [facility] to clarify rx for pt's ritalin.  Nothing further needed.

## 2015-03-20 ENCOUNTER — Telehealth: Payer: Self-pay | Admitting: Pulmonary Disease

## 2015-03-20 ENCOUNTER — Encounter: Payer: Self-pay | Admitting: Pulmonary Disease

## 2015-03-20 NOTE — Telephone Encounter (Signed)
lmtcb X1 for Melissa at [facility]. 

## 2015-03-21 NOTE — Telephone Encounter (Signed)
Melissa returning call.Stanley A Dalton °

## 2015-03-21 NOTE — Telephone Encounter (Signed)
LMTCB for Sophia West  

## 2015-03-21 NOTE — Telephone Encounter (Signed)
Spoke with Sophia West. States that we have to have documentation the pt's chart as to why she waited so long after her sleep study was done to start CPAP therapy.  Spoke with pt. States that when her sleep study was done in 2015, her current provider was more concerned with treating her narcolepsy than treating her sleep apnea.  VS - this will need to be documented in the pt's chart per [facility]. Can you add this to the OV note? Thanks.

## 2015-03-23 MED FILL — AMOXICILLIN 500 MG CAPSULE: 500 | 3 days supply | Qty: 9 | Fill #0

## 2015-03-23 MED FILL — HYDROCODON-APAP 5-325: 5-325 | 2 days supply | Qty: 25 | Fill #0

## 2015-03-23 MED FILL — LIOTHYRONINE SOD 50 MCG TAB: 50 | 30 days supply | Qty: 30 | Fill #2

## 2015-03-26 NOTE — Telephone Encounter (Signed)
I have made addendum to my note.

## 2015-03-26 NOTE — Telephone Encounter (Signed)
I called spoke with Melissa and made her aware. She will pull the note. Nothing further needed

## 2015-03-28 DIAGNOSIS — F3341 Major depressive disorder, recurrent, in partial remission: Secondary | ICD-10-CM | POA: Diagnosis not present

## 2015-03-28 MED FILL — ALPRAZolam 1 MG TABS: 1 | 10 days supply | Qty: 30 | Fill #0

## 2015-03-29 MED FILL — BUPROPION HCL XL 300 MG TAB: 300 | 90 days supply | Qty: 90 | Fill #0

## 2015-04-03 ENCOUNTER — Telehealth: Payer: Self-pay | Admitting: Pulmonary Disease

## 2015-04-03 MED FILL — ESCITALOPRAM 20 MG TABLET: 20 | 90 days supply | Qty: 90 | Fill #0

## 2015-04-03 NOTE — Telephone Encounter (Signed)
Patient Returned call  [phone]

## 2015-04-03 NOTE — Telephone Encounter (Signed)
LM x 1 for patient to help with getting her old sleep study.  Pt filled out a Medical Release while in office and this has been faxed several times. Will await call back from patient

## 2015-04-03 NOTE — Telephone Encounter (Signed)
Pt called back. Made aware of below. She is going to give them a call to see what she can do. Will let ashtyn know.

## 2015-04-03 NOTE — Telephone Encounter (Signed)
LMTCB for pt 

## 2015-04-04 DIAGNOSIS — K1329 Other disturbances of oral epithelium, including tongue: Secondary | ICD-10-CM | POA: Diagnosis not present

## 2015-04-04 NOTE — Telephone Encounter (Signed)
Records received and given to [facility] to fax over to [facility].  Nothing further needed.

## 2015-04-12 DIAGNOSIS — G4733 Obstructive sleep apnea (adult) (pediatric): Secondary | ICD-10-CM | POA: Diagnosis not present

## 2015-04-13 MED FILL — SYNTHROID 175 MCG TABLET: 175 | 30 days supply | Qty: 30 | Fill #10

## 2015-04-23 DIAGNOSIS — J329 Chronic sinusitis, unspecified: Secondary | ICD-10-CM | POA: Diagnosis not present

## 2015-04-23 MED FILL — PROMETHAZINE-DM SYRUP: 6.25-15 | 7 days supply | Qty: 140 | Fill #0

## 2015-04-23 MED FILL — LIOTHYRONINE SOD 50 MCG TAB: 50 | 30 days supply | Qty: 30 | Fill #3

## 2015-04-23 MED FILL — AMOX-CLAV 500-125 MG TABLET: 500-125 | 10 days supply | Qty: 20 | Fill #0

## 2015-05-01 MED FILL — AZITHROMYCIN 250 MG TABLET: 250 | 5 days supply | Qty: 6 | Fill #0

## 2015-05-01 MED FILL — FLUCONAZOLE 150 MG TABLET: 150 | 1 days supply | Qty: 1 | Fill #0

## 2015-05-07 DIAGNOSIS — F3341 Major depressive disorder, recurrent, in partial remission: Secondary | ICD-10-CM | POA: Diagnosis not present

## 2015-05-10 DIAGNOSIS — G4733 Obstructive sleep apnea (adult) (pediatric): Secondary | ICD-10-CM | POA: Diagnosis not present

## 2015-05-14 ENCOUNTER — Encounter: Payer: Self-pay | Admitting: Adult Health

## 2015-05-14 ENCOUNTER — Ambulatory Visit (INDEPENDENT_AMBULATORY_CARE_PROVIDER_SITE_OTHER): Payer: 59 | Admitting: Adult Health

## 2015-05-14 VITALS — BP 132/80 | HR 105 | Temp 97.7°F | Ht 67.0 in | Wt 255.0 lb

## 2015-05-14 DIAGNOSIS — J309 Allergic rhinitis, unspecified: Secondary | ICD-10-CM | POA: Diagnosis not present

## 2015-05-14 DIAGNOSIS — G4730 Sleep apnea, unspecified: Secondary | ICD-10-CM

## 2015-05-14 DIAGNOSIS — G4733 Obstructive sleep apnea (adult) (pediatric): Secondary | ICD-10-CM | POA: Diagnosis not present

## 2015-05-14 DIAGNOSIS — G4710 Hypersomnia, unspecified: Secondary | ICD-10-CM

## 2015-05-14 MED ORDER — METHYLPHENIDATE HCL 10 MG PO TABS: 10.0000 mg | ORAL_TABLET | ORAL | Status: DC | PRN

## 2015-05-14 MED ORDER — METHYLPHENIDATE HCL ER (LA) 20 MG PO CP24: 20.0000 mg | ORAL_CAPSULE | ORAL | Status: DC

## 2015-05-14 MED FILL — ALPRAZolam 0.5 MG TABS: 0.5 | 10 days supply | Qty: 30 | Fill #0

## 2015-05-14 MED FILL — SYNTHROID 175 MCG TABLET: 175 | 30 days supply | Qty: 30 | Fill #11

## 2015-05-14 MED FILL — METHYLPHENIDATE LA 20 MG CA: 20 | 30 days supply | Qty: 30 | Fill #0

## 2015-05-14 NOTE — Assessment & Plan Note (Signed)
Wear CPAP At bedtime   Goal is at least 4 hr each night  Do not drive if sleepy .  Work on weight loss.  follow up Dr. Halford Chessman  In 6 months and As needed   Please contact office for sooner follow up if symptoms do not improve or worsen or seek emergency care

## 2015-05-14 NOTE — Assessment & Plan Note (Signed)
Hypersomnia with OSA w/ possible narcolepsy   Plan  Cont on Ritalin follow up Dr. Halford Chessman  In 6 months and As needed   Please contact office for sooner follow up if symptoms do not improve or worsen or seek emergency care

## 2015-05-14 NOTE — Progress Notes (Signed)
Subjective:    Patient ID: Sophia West, female    DOB: [birth year], 25 y.o.   MRN: [id number]  HPI 25 yo female with Bipolar Dz , OSA  Deviated septum surgery 2009.   TEST  PSG >AHI 5.2 , SpO2 low 88%   05/14/2015 Follow up : OSA  Pt returns for follow up for Sleep apnea.  . Uses CPAP on average 2-3 hours nightly. Pt states that she has difficulty using the CPAP due to mouth breathing. Says she could wear a lot more if her nose was not so stuffy.  Has chronic rhinitis with daily congestion, drainage and stuffiness. Worse since nasal surgery .  Uses Zyrtec daily . No nasal sprays.  We discussed using saline spray and gel for sx control.  Works at [employer] , [place].  Does feel some rest  Uses Ritalin for hypersomnia w/ possible narcolepsy Says it helps with her daytime sleepiness.    Past Medical History  Diagnosis Date  . Bipolar 1 disorder ([place])   . Asthma   . Thyroid ca ([place])   . Seasonal allergies   . Hypersomnia   . OSA (obstructive sleep apnea)   . Hurthle cell carcinoma of thyroid [facility])    Current Outpatient Prescriptions on File Prior to Visit  Medication Sig Dispense Refill  . albuterol (PROVENTIL HFA;VENTOLIN HFA) 108 (90 BASE) MCG/ACT inhaler Inhale 2 puffs into the lungs every 6 (six) hours as needed for wheezing.    Marland Kitchen ALPRAZolam (XANAX) 1 MG tablet Take 1 mg by mouth 3 (three) times daily as needed for anxiety.     . cetirizine (ZYRTEC) 10 MG tablet Take 10 mg by mouth daily.    Marland Kitchen escitalopram (LEXAPRO) 10 MG tablet Take 10 mg by mouth daily.    Marland Kitchen etonogestrel (NEXPLANON) 68 MG IMPL implant Inject 1 each into the skin once.    Marland Kitchen levothyroxine (SYNTHROID, LEVOTHROID) 175 MCG tablet Take 175 mcg by mouth daily before breakfast.    . liothyronine (CYTOMEL) 50 MCG tablet Take 50 mcg by mouth daily.    . methylphenidate (RITALIN LA) 20 MG 24 hr capsule Take 1 capsule (20 mg total) by mouth every morning. 30 capsule 0  . methylphenidate (RITALIN) 10 MG tablet Take 1 tablet  (10 mg total) by mouth as needed (trouble focussing). 30 tablet 0   No current facility-administered medications on file prior to visit.     Review of Systems Constitutional:   No  weight loss, night sweats,  Fevers, chills, + fatigue, or  lassitude.  HEENT:   No headaches,  Difficulty swallowing,  Tooth/dental problems, or  Sore throat,                No sneezing, itching, ear ache,  +nasal congestion, post nasal drip,   CV:  No chest pain,  Orthopnea, PND, swelling in lower extremities, anasarca, dizziness, palpitations, syncope.   GI  No heartburn, indigestion, abdominal pain, nausea, vomiting, diarrhea, change in bowel habits, loss of appetite, bloody stools.   Resp: No shortness of breath with exertion or at rest.  No excess mucus, no productive cough,  No non-productive cough,  No coughing up of blood.  No change in color of mucus.  No wheezing.  No chest wall deformity  Skin: no rash or lesions.  GU: no dysuria, change in color of urine, no urgency or frequency.  No flank pain, no hematuria   MS:  No joint pain or swelling.  No decreased range  of motion.  No back pain.  Psych:  No change in mood or affect. No depression or anxiety.  No memory loss.         Objective:   Physical Exam Filed Vitals:   05/14/15 0937  BP: 132/80  Pulse: 105  Temp: 97.7 F (36.5 C)  TempSrc: Oral  Height: 5\' 7"  (1.702 m)  Weight: 255 lb (115.667 kg)  SpO2: 97%   Body mass index is 39.93 kg/(m^2).  GEN: A/Ox3; pleasant , NAD, obese   HEENT:  [place]/AT,  EACs-clear, TMs-wnl, NOSE- mild congestion L>R , THROAT-clear, no lesions, no postnasal drip or exudate noted.  Class 2-3 MP airway   NECK:  Supple w/ fair ROM; no JVD; normal carotid impulses w/o bruits; no thyromegaly or nodules palpated; no lymphadenopathy.  RESP  Clear  P & A; w/o, wheezes/ rales/ or rhonchi.no accessory muscle use, no dullness to percussion  CARD:  RRR, no m/r/g  , no peripheral edema, pulses intact, no cyanosis  or clubbing.  GI:   Soft & nt; nml bowel sounds; no organomegaly or masses detected.  Musco: Warm bil, no deformities or joint swelling noted.   Neuro: alert, no focal deficits noted.    Skin: Warm, no lesions or rashes  Anh Mangano NP-C  [facility]  05/14/2015        Assessment & Plan:

## 2015-05-14 NOTE — Assessment & Plan Note (Signed)
Begin Saline nasal spray several times a day As needed  Congestion  Use Saline nasal gel At bedtime  .  Continue on Zyrtec 10mg  At bedtime   Begin Flonase 1 puff Twice daily  .  follow up Dr. Halford Chessman  In 6 months and As needed   Please contact office for sooner follow up if symptoms do not improve or worsen or seek emergency care

## 2015-05-14 NOTE — Patient Instructions (Signed)
Begin Saline nasal spray several times a day As needed  Congestion  Use Saline nasal gel At bedtime  .  Continue on Zyrtec 10mg  At bedtime   Begin Flonase 1 puff Twice daily  .  Wear CPAP At bedtime   Goal is at least 4 hr each night  Do not drive if sleepy .  Work on weight loss.  follow up Dr. Halford Chessman  In 6 months and As needed   Please contact office for sooner follow up if symptoms do not improve or worsen or seek emergency care

## 2015-05-18 NOTE — Progress Notes (Signed)
Reviewed and agree with assessment/plan. 

## 2015-05-28 DIAGNOSIS — J309 Allergic rhinitis, unspecified: Secondary | ICD-10-CM | POA: Diagnosis not present

## 2015-05-28 DIAGNOSIS — E559 Vitamin D deficiency, unspecified: Secondary | ICD-10-CM | POA: Diagnosis not present

## 2015-05-28 DIAGNOSIS — Z85850 Personal history of malignant neoplasm of thyroid: Secondary | ICD-10-CM | POA: Diagnosis not present

## 2015-05-28 DIAGNOSIS — L509 Urticaria, unspecified: Secondary | ICD-10-CM | POA: Diagnosis not present

## 2015-05-28 DIAGNOSIS — Z0001 Encounter for general adult medical examination with abnormal findings: Secondary | ICD-10-CM | POA: Diagnosis not present

## 2015-05-28 DIAGNOSIS — E669 Obesity, unspecified: Secondary | ICD-10-CM | POA: Diagnosis not present

## 2015-05-28 DIAGNOSIS — E890 Postprocedural hypothyroidism: Secondary | ICD-10-CM | POA: Diagnosis not present

## 2015-05-28 MED FILL — LIOTHYRONINE SOD 50 MCG TAB: 50 | 30 days supply | Qty: 30 | Fill #4

## 2015-05-28 MED FILL — LEVOCETIRIZINE 5 MG TABLET: 5 | 30 days supply | Qty: 30 | Fill #0

## 2015-05-29 MED FILL — SYNTHROID 150 MCG TABLET: 150 | 30 days supply | Qty: 30 | Fill #0

## 2015-06-25 MED FILL — LIOTHYRONINE SOD 50 MCG TAB: 50 | 30 days supply | Qty: 30 | Fill #5

## 2015-06-29 ENCOUNTER — Telehealth: Payer: Self-pay | Admitting: Pulmonary Disease

## 2015-06-29 NOTE — Telephone Encounter (Signed)
Patient calling to get refill on Ritalin. Last OV: 05/14/15 No follow up scheduled  Last refilled: 05/14/15  Dr.Sood, ok to refill?

## 2015-06-29 NOTE — Telephone Encounter (Signed)
Left message for patient to call back  

## 2015-06-29 NOTE — Telephone Encounter (Signed)
Okay to send refill for ritalin LA 20 mg daily, dispense 30 pills with 4 refills.  Okay to send refill for ritalin 10 mg daily prn excessive daytime sleepiness, dispense 30 pills with 1 refill.  Please ensure she has follow up scheduled with me for September 2017.

## 2015-07-02 MED ORDER — METHYLPHENIDATE HCL 10 MG PO TABS: 10.0000 mg | ORAL_TABLET | ORAL | Status: DC | PRN

## 2015-07-02 MED ORDER — METHYLPHENIDATE HCL ER (LA) 20 MG PO CP24: 20.0000 mg | ORAL_CAPSULE | ORAL | Status: DC

## 2015-07-02 MED FILL — ALPRAZolam 0.5 MG TABS: 0.5 | 20 days supply | Qty: 60 | Fill #0

## 2015-07-02 MED FILL — METHYLPHENIDATE 10 MG TAB: 10 | 30 days supply | Qty: 30 | Fill #0

## 2015-07-02 MED FILL — SYNTHROID 150 MCG TABLET: 150 | 30 days supply | Qty: 30 | Fill #1

## 2015-07-02 MED FILL — METHYLPHENIDATE ER 20 MG CA: 20 | 30 days supply | Qty: 30 | Fill #0

## 2015-07-02 NOTE — Telephone Encounter (Signed)
Rx placed up front to be picked up.  Left a detailed message on patient's voicemail (name verified) Note attached to envelope asking for front staff to let me know when this is picked up

## 2015-07-02 NOTE — Telephone Encounter (Signed)
Spoke with pt.  Rxs printed and given to Ashtyn for VS to sign during office this am.  Pt aware we call for her for pick up once rxs signed.  She verbalized understanding.

## 2015-07-02 NOTE — Telephone Encounter (Signed)
Pt in the lobby waiting 

## 2015-07-03 NOTE — Telephone Encounter (Signed)
rx has been picked up by patient.  Nothing further needed.

## 2015-07-09 MED FILL — LEVOCETIRIZINE 5 MG TABLET: 5 | 30 days supply | Qty: 30 | Fill #1

## 2015-07-18 ENCOUNTER — Emergency Department (HOSPITAL_BASED_OUTPATIENT_CLINIC_OR_DEPARTMENT_OTHER): Payer: 59

## 2015-07-18 ENCOUNTER — Encounter (HOSPITAL_BASED_OUTPATIENT_CLINIC_OR_DEPARTMENT_OTHER): Payer: Self-pay | Admitting: Emergency Medicine

## 2015-07-18 ENCOUNTER — Emergency Department (HOSPITAL_BASED_OUTPATIENT_CLINIC_OR_DEPARTMENT_OTHER)
Admission: EM | Admit: 2015-07-18 | Discharge: 2015-07-18 | Disposition: A | Discharge status: Home / Self Care | Payer: 59 | Attending: Emergency Medicine | Admitting: Emergency Medicine

## 2015-07-18 DIAGNOSIS — Z85850 Personal history of malignant neoplasm of thyroid: Secondary | ICD-10-CM | POA: Diagnosis not present

## 2015-07-18 DIAGNOSIS — W19XXXA Unspecified fall, initial encounter: Secondary | ICD-10-CM | POA: Diagnosis not present

## 2015-07-18 DIAGNOSIS — S92302A Fracture of unspecified metatarsal bone(s), left foot, initial encounter for closed fracture: Secondary | ICD-10-CM

## 2015-07-18 DIAGNOSIS — S93402A Sprain of unspecified ligament of left ankle, initial encounter: Secondary | ICD-10-CM | POA: Diagnosis not present

## 2015-07-18 DIAGNOSIS — Y929 Unspecified place or not applicable: Secondary | ICD-10-CM | POA: Diagnosis not present

## 2015-07-18 DIAGNOSIS — Y999 Unspecified external cause status: Secondary | ICD-10-CM | POA: Diagnosis not present

## 2015-07-18 DIAGNOSIS — Y9301 Activity, walking, marching and hiking: Secondary | ICD-10-CM | POA: Diagnosis not present

## 2015-07-18 DIAGNOSIS — J45909 Unspecified asthma, uncomplicated: Secondary | ICD-10-CM | POA: Diagnosis not present

## 2015-07-18 DIAGNOSIS — Z87891 Personal history of nicotine dependence: Secondary | ICD-10-CM | POA: Insufficient documentation

## 2015-07-18 DIAGNOSIS — F319 Bipolar disorder, unspecified: Secondary | ICD-10-CM | POA: Diagnosis not present

## 2015-07-18 DIAGNOSIS — S92352A Displaced fracture of fifth metatarsal bone, left foot, initial encounter for closed fracture: Secondary | ICD-10-CM | POA: Diagnosis not present

## 2015-07-18 DIAGNOSIS — S62317A Displaced fracture of base of fifth metacarpal bone. left hand, initial encounter for closed fracture: Secondary | ICD-10-CM | POA: Diagnosis not present

## 2015-07-18 DIAGNOSIS — S99912A Unspecified injury of left ankle, initial encounter: Secondary | ICD-10-CM | POA: Diagnosis present

## 2015-07-18 HISTORY — DX: Major depressive disorder, single episode, unspecified: F32.9

## 2015-07-18 HISTORY — DX: Depression, unspecified: F32.A

## 2015-07-18 HISTORY — DX: Anxiety disorder, unspecified: F41.9

## 2015-07-18 MED ORDER — IBUPROFEN 800 MG PO TABS: 800.0000 mg | ORAL_TABLET | Freq: Three times a day (TID) | ORAL | Status: DC

## 2015-07-18 MED ORDER — TRAMADOL HCL 50 MG PO TABS: 50.0000 mg | ORAL_TABLET | Freq: Four times a day (QID) | ORAL | Status: DC | PRN

## 2015-07-18 MED FILL — IBUPROFEN 800 MG TABLET: 800 | 7 days supply | Qty: 21 | Fill #0

## 2015-07-18 MED FILL — traMADol HCL 50 MG TABS: 50 | 2 days supply | Qty: 10 | Fill #0

## 2015-07-18 NOTE — ED Notes (Addendum)
Pt fell and twisted left ankle this afternoon.  Pain and swelling laterally.  Pt took Advil 30 min PTA.

## 2015-07-18 NOTE — ED Provider Notes (Signed)
CSN: [id number]     Arrival date & time 07/18/15  1609 History   First MD Initiated Contact with Patient 07/18/15 1647     Chief Complaint  Patient presents with  . Ankle Injury     (Consider location/radiation/quality/duration/timing/severity/associated sxs/prior Treatment) HPI Comments: Patient presents with left ankle and foot injury or any acutely this afternoon when she twisted her ankle while walking. She has been unable to bear weight. She took ibuprofen prior to arrival. Patient complains of pain mostly on the lateral aspect of her foot. The onset of this condition was acute. The course is constant. Aggravating factors: Movement and palpation. Alleviating factors: none.    Patient is a 25 y.o. female presenting with lower extremity injury. The history is provided by the patient.  Ankle Injury Associated symptoms include arthralgias. Pertinent negatives include no joint swelling, neck pain, numbness or weakness.    Past Medical History  Diagnosis Date  . Bipolar 1 disorder ([place])   . Asthma   . Thyroid ca ([place])   . Seasonal allergies   . Hypersomnia   . OSA (obstructive sleep apnea)   . 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 Depression   . Anxiety   . Narcolepsy    Past Surgical History  Procedure Laterality Date  . Rhinoplasty  2009  . Tonsilectomy, adenoidectomy, bilateral myringotomy and tubes  2009  . Breast surgery  12/2009    lump removed left breast  . Thyroidectomy  12/20/2012  . Thyroid surgery     Family History  Problem Relation Age of Onset  . Cancer Other     GREAT AUNT  . Asthma Brother    Social History  Substance Use Topics  . Smoking status: Former Smoker -- 1.00 packs/day for 8 years    Types: Cigarettes    Quit date: 04/03/2012  . Smokeless tobacco: Never Used  . Alcohol Use: 0.6 oz/week    1 Glasses of wine per week     Comment: occ   OB History    No data available     Review of Systems  Constitutional: Negative for activity  change.  Musculoskeletal: Positive for arthralgias and gait problem. Negative for back pain, joint swelling and neck pain.  Skin: Negative for wound.  Neurological: Negative for weakness and numbness.      Allergies  Review of patient's allergies indicates no known allergies.  Home Medications   Prior to Admission medications   Medication Sig Start Date End Date Taking? Authorizing Provider  albuterol (PROVENTIL HFA;VENTOLIN HFA) 108 (90 BASE) MCG/ACT inhaler Inhale 2 puffs into the lungs every 6 (six) hours as needed for wheezing.    Historical Provider, MD  ALPRAZolam Duanne Moron) 1 MG tablet Take 1 mg by mouth 3 (three) times daily as needed for anxiety.     Historical Provider, MD  buPROPion (WELLBUTRIN XL) 300 MG 24 hr tablet Take 1 tablet by mouth daily.    Historical Provider, MD  cetirizine (ZYRTEC) 10 MG tablet Take 10 mg by mouth daily.    Historical Provider, MD  escitalopram (LEXAPRO) 10 MG tablet Take 10 mg by mouth daily.    Historical Provider, MD  etonogestrel (NEXPLANON) 68 MG IMPL implant Inject 1 each into the skin once.    Historical Provider, MD  ibuprofen (ADVIL,MOTRIN) 800 MG tablet Take 1 tablet (800 mg total) by mouth 3 (three) times daily. 07/18/15   Carlisle Cater, PA-C  levothyroxine (SYNTHROID, LEVOTHROID) 175 MCG tablet Take 175 mcg  by mouth daily before breakfast.    Historical Provider, MD  liothyronine (CYTOMEL) 50 MCG tablet Take 50 mcg by mouth daily.    Historical Provider, MD  methylphenidate (RITALIN LA) 20 MG 24 hr capsule Take 1 capsule (20 mg total) by mouth every morning. 07/02/15   Chesley Mires, MD  methylphenidate (RITALIN) 10 MG tablet Take 1 tablet (10 mg total) by mouth as needed (excessive daytime sleepiness). 07/02/15   Chesley Mires, MD  traMADol (ULTRAM) 50 MG tablet Take 1 tablet (50 mg total) by mouth every 6 (six) hours as needed. 07/18/15   Carlisle Cater, PA-C   BP 141/95 mmHg  Pulse 87  Temp(Src) 99.2 F (37.3 C) (Oral)  Resp 16  Ht 5\' 8"   (1.727 m)  Wt 114.306 kg  BMI 38.33 kg/m2  SpO2 99% Physical Exam  Constitutional: She appears well-developed and well-nourished.  HENT:  Head: Normocephalic and atraumatic.  Eyes: Pupils are equal, round, and reactive to light.  Neck: Normal range of motion. Neck supple.  Cardiovascular: Exam reveals no decreased pulses.   Musculoskeletal: She exhibits tenderness. She exhibits no edema.       Left knee: Normal.       Left ankle: She exhibits swelling. Tenderness. Lateral malleolus and head of 5th metatarsal tenderness found. No medial malleolus and no proximal fibula tenderness found. Achilles tendon normal.       Left lower leg: Normal.       Left foot: There is tenderness and bony tenderness.  Neurological: She is alert. No sensory deficit.  Motor, sensation, and vascular distal to the injury is fully intact.   Skin: Skin is warm and dry.  Psychiatric: She has a normal mood and affect.  Nursing note and vitals reviewed.   ED Course  Procedures (including critical care time)  Imaging Review Dg Ankle Complete Left  07/18/2015  CLINICAL DATA:  The patient fell and twisted her left ankle today. Pain. Initial encounter. EXAM: LEFT ANKLE COMPLETE - 3+ VIEW COMPARISON:  None. FINDINGS: There is an avulsion fracture of the base of the fifth metatarsal at the insertion site of the peroneus brevis tendon. No other acute bony or joint abnormality is identified. IMPRESSION: Avulsion fracture base of fifth metatarsal of the peroneus brevis insertion site. Electronically Signed   By: Inge Rise M.D.   On: 07/18/2015 16:54   I have personally reviewed and evaluated these images and lab results as part of my medical decision-making.  5:21 PM Patient seen and examined. Informed of x-ray results. Will place in [place] and crutches. Orthopedic follow-up given. NSAIDs and pain medication prescribed.  Vital signs reviewed and are as follows: BP 141/95 mmHg  Pulse 87  Temp(Src) 99.2 F  (37.3 C) (Oral)  Resp 16  Ht 5\' 8"  (1.727 m)  Wt 114.306 kg  BMI 38.33 kg/m2  SpO2 99%   Patient was counseled on RICE protocol and told to rest injury, use ice for no longer than 15 minutes every hour, compress the area, and elevate above the level of their heart as much as possible to reduce swelling. Questions answered. Patient verbalized understanding.    MDM   Final diagnoses:  Metatarsal fracture, left, closed, initial encounter  Ankle sprain, left, initial encounter   Patient with ankle sprain and left fifth metatarsal fracture. Lower extremity is neurovascularly intact. Treatment as above.    Carlisle Cater, PA-C 07/18/15 [place], MD 07/18/15 2312

## 2015-07-18 NOTE — Discharge Instructions (Signed)
Please read and follow all provided instructions.  Your diagnoses today include:  1. Metatarsal fracture, left, closed, initial encounter   2. Ankle sprain, left, initial encounter     Tests performed today include:  An x-ray of the affected area - shows an avulsion fracture of 5th metatarsal  Vital signs. See below for your results today.   Medications prescribed:   Tramadol - narcotic-like pain medication  DO NOT drive or perform any activities that require you to be awake and alert because this medicine can make you drowsy.    Ibuprofen (Motrin, Advil) - anti-inflammatory pain medication  Do not exceed 600mg  ibuprofen every 6 hours, take with food  You have been prescribed an anti-inflammatory medication or NSAID. Take with food. Take smallest effective dose for the shortest duration needed for your pain. Stop taking if you experience stomach pain or vomiting.   Take any prescribed medications only as directed.  Home care instructions:   Follow any educational materials contained in this packet  Follow R.I.C.E. Protocol:  R - rest your injury   I  - use ice on injury without applying directly to skin  C - compress injury with bandage or splint  E - elevate the injury as much as possible  Follow-up instructions: Please follow-up with your primary care provider or the provided orthopedic physician (bone specialist) in 2 weeks.   Return instructions:   Please return if your toes or feet are numb or tingling, appear gray or blue, or you have severe pain (also elevate the leg and loosen splint or wrap if you were given one)  Please return to the Emergency Department if you experience worsening symptoms.   Please return if you have any other emergent concerns.  Additional Information:  Your vital signs today were: BP 141/95 mmHg   Pulse 87   Temp(Src) 99.2 F (37.3 C) (Oral)   Resp 16   Ht 5\' 8"  (1.727 m)   Wt 114.306 kg   BMI 38.33 kg/m2   SpO2 99% If your  blood pressure (BP) was elevated above 135/85 this visit, please have this repeated by your doctor within one month. -------------- If prescribed crutches for your injury: use crutches with non-weight bearing for the first few days. Then, you may walk as the pain allows, or as instructed. Start gradually with weight bearing on the affected side. Once you can walk pain free, then try jogging. When you can run forwards, then you can try moving side-to-side. If you cannot walk without crutches in one week, you need a re-check. --------------

## 2015-07-18 NOTE — ED Notes (Signed)
Given d/c instructions. Rx x 2. Verbalizes understanding. No questions.

## 2015-07-19 ENCOUNTER — Encounter: Payer: Self-pay | Admitting: Pulmonary Disease

## 2015-07-23 DIAGNOSIS — E039 Hypothyroidism, unspecified: Secondary | ICD-10-CM | POA: Diagnosis not present

## 2015-07-23 DIAGNOSIS — E890 Postprocedural hypothyroidism: Secondary | ICD-10-CM | POA: Diagnosis not present

## 2015-07-23 DIAGNOSIS — S92902A Unspecified fracture of left foot, initial encounter for closed fracture: Secondary | ICD-10-CM | POA: Diagnosis not present

## 2015-07-23 MED FILL — SYNTHROID 125 MCG TABLET: 125 | 30 days supply | Qty: 30 | Fill #0

## 2015-07-24 MED FILL — IBUPROFEN 800 MG TABLET: 800 | 10 days supply | Qty: 30 | Fill #0

## 2015-07-24 MED FILL — LIOTHYRONINE SOD 50 MCG TAB: 50 | 30 days supply | Qty: 30 | Fill #6

## 2015-07-26 ENCOUNTER — Ambulatory Visit (INDEPENDENT_AMBULATORY_CARE_PROVIDER_SITE_OTHER): Payer: 59 | Admitting: Family Medicine

## 2015-07-26 ENCOUNTER — Encounter: Payer: Self-pay | Admitting: Family Medicine

## 2015-07-26 VITALS — BP 125/82 | HR 91 | Ht 68.0 in | Wt 250.0 lb

## 2015-07-26 DIAGNOSIS — S99922A Unspecified injury of left foot, initial encounter: Secondary | ICD-10-CM | POA: Diagnosis not present

## 2015-07-26 NOTE — Patient Instructions (Signed)
You have an avulsion fracture of your 5th metatarsal. Wear the boot when up and walking around. Use crutch or crutches for support. When you feel comfortable you can stop using the crutches. Tylenol or ibuprofen as needed for pain. Elevate above your heart and ice this as needed for swelling. Follow up with me in 2 weeks for reevaluation, to repeat your x-rays. Expect to be in the boot for 4-6 total weeks.

## 2015-07-27 DIAGNOSIS — S99922A Unspecified injury of left foot, initial encounter: Secondary | ICD-10-CM | POA: Insufficient documentation

## 2015-07-27 NOTE — Assessment & Plan Note (Signed)
independently reviewed radiographs - avulsion type fracture of 5th metatarsal.  Boot when up and walking around.  Crutches if needed.  Tylenol or motrin, icing, elevation.  F/u in 2 weeks for reevaluation, repeat x-rays.

## 2015-07-27 NOTE — Progress Notes (Signed)
PCP: Aretta Nip, MD  Subjective:   HPI: Patient is a 25 y.o. female here for left foot injury.  Patient reports on 5/15 she was walking out the front door, stepped wrong and inverted her left ankle. Immediate pain, swelling lateral left foot. Difficulty bearing weight. Went to ED - placed in boot with crutch for avulsion fracture. Has been icing. Pain level 7/10 and sharp in this area. No skin changes, numbness. No prior injuries.  Past Medical History  Diagnosis Date  . Bipolar 1 disorder ([place])   . Asthma   . Thyroid ca ([place])   . Seasonal allergies   . Hypersomnia   . OSA (obstructive sleep apnea)   . 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 Depression   . Anxiety   . Narcolepsy     Current Outpatient Prescriptions on File Prior to Visit  Medication Sig Dispense Refill  . albuterol (PROVENTIL HFA;VENTOLIN HFA) 108 (90 BASE) MCG/ACT inhaler Inhale 2 puffs into the lungs every 6 (six) hours as needed for wheezing.    Marland Kitchen buPROPion (WELLBUTRIN XL) 300 MG 24 hr tablet Take 1 tablet by mouth daily.    . cetirizine (ZYRTEC) 10 MG tablet Take 10 mg by mouth daily.    Marland Kitchen escitalopram (LEXAPRO) 10 MG tablet Take 10 mg by mouth daily.    Marland Kitchen etonogestrel (NEXPLANON) 68 MG IMPL implant Inject 1 each into the skin once.    Marland Kitchen liothyronine (CYTOMEL) 50 MCG tablet Take 50 mcg by mouth daily.    . methylphenidate (RITALIN LA) 20 MG 24 hr capsule Take 1 capsule (20 mg total) by mouth every morning. 30 capsule 0  . methylphenidate (RITALIN) 10 MG tablet Take 1 tablet (10 mg total) by mouth as needed (excessive daytime sleepiness). 30 tablet 0  . traMADol (ULTRAM) 50 MG tablet Take 1 tablet (50 mg total) by mouth every 6 (six) hours as needed. 10 tablet 0   No current facility-administered medications on file prior to visit.    Past Surgical History  Procedure Laterality Date  . Rhinoplasty  2009  . Tonsilectomy, adenoidectomy, bilateral myringotomy and tubes  2009  . Breast  surgery  12/2009    lump removed left breast  . Thyroidectomy  12/20/2012  . Thyroid surgery      No Known Allergies  Social History   Social History  . Marital Status: Single    Spouse Name: N/A  . Number of Children: N/A  . Years of Education: N/A   Occupational History  . Nurse Tech   . Student    Social History Main Topics  . Smoking status: Former Smoker -- 1.00 packs/day for 8 years    Types: Cigarettes    Quit date: 04/03/2012  . Smokeless tobacco: Never Used  . Alcohol Use: 0.6 oz/week    1 Glasses of wine per week     Comment: occ  . Drug Use: No  . Sexual Activity: Yes    Birth Control/ Protection: Implant   Other Topics Concern  . Not on file   Social History Narrative    Family History  Problem Relation Age of Onset  . Cancer Other     GREAT AUNT  . Asthma Brother     BP 125/82 mmHg  Pulse 91  Ht 5\' 8"  (1.727 m)  Wt 250 lb (113.399 kg)  BMI 38.02 kg/m2  Review of Systems: See HPI above.    Objective:  Physical Exam:  Gen: NAD, comfortable  in exam room  Left foot/ankle: Mild lateral foot swelling.  No bruising, other deformity. Did not test extent of motion with known fracture. TTP base 5th metatarsal only.  No other tenderness. Negative ant drawer and talar tilt.   Negative syndesmotic compression. Thompsons test negative. NV intact distally.  Right foot/ankle: FROM without pain.    Assessment & Plan:  1. Left foot injury - independently reviewed radiographs - avulsion type fracture of 5th metatarsal.  Boot when up and walking around.  Crutches if needed.  Tylenol or motrin, icing, elevation.  F/u in 2 weeks for reevaluation, repeat x-rays.

## 2015-08-04 ENCOUNTER — Encounter: Payer: Self-pay | Admitting: Adult Health

## 2015-08-06 ENCOUNTER — Ambulatory Visit: Payer: Self-pay | Admitting: Family Medicine

## 2015-08-13 ENCOUNTER — Ambulatory Visit (HOSPITAL_BASED_OUTPATIENT_CLINIC_OR_DEPARTMENT_OTHER)
Admission: RE | Admit: 2015-08-13 | Discharge: 2015-08-13 | Disposition: A | Discharge status: Home / Self Care | Payer: 59 | Source: Ambulatory Visit | Attending: Family Medicine | Admitting: Family Medicine

## 2015-08-13 ENCOUNTER — Ambulatory Visit (INDEPENDENT_AMBULATORY_CARE_PROVIDER_SITE_OTHER): Payer: 59 | Admitting: Family Medicine

## 2015-08-13 ENCOUNTER — Encounter: Payer: Self-pay | Admitting: Family Medicine

## 2015-08-13 VITALS — BP 122/83 | HR 106 | Ht 68.0 in | Wt 252.0 lb

## 2015-08-13 DIAGNOSIS — X58XXXD Exposure to other specified factors, subsequent encounter: Secondary | ICD-10-CM | POA: Diagnosis not present

## 2015-08-13 DIAGNOSIS — S99922D Unspecified injury of left foot, subsequent encounter: Secondary | ICD-10-CM | POA: Diagnosis not present

## 2015-08-13 DIAGNOSIS — E669 Obesity, unspecified: Secondary | ICD-10-CM | POA: Diagnosis not present

## 2015-08-13 DIAGNOSIS — C73 Malignant neoplasm of thyroid gland: Secondary | ICD-10-CM | POA: Diagnosis not present

## 2015-08-13 DIAGNOSIS — S99922A Unspecified injury of left foot, initial encounter: Secondary | ICD-10-CM | POA: Diagnosis not present

## 2015-08-13 MED FILL — LIOTHYRONINE SOD 25 MCG TAB: 25 | 30 days supply | Qty: 30 | Fill #0

## 2015-08-13 NOTE — Patient Instructions (Signed)
Transition out of the boot and into a well supportive shoe. Keep boot around in case you need it. Icing 15 minutes at a time 3-4 times a day as needed. Ibuprofen or aleve if needed for pain. Follow up with me in 2-3 weeks for reevaluation.

## 2015-08-14 MED FILL — SYNTHROID 150 MCG TABLET: 150 | 30 days supply | Qty: 30 | Fill #0

## 2015-08-15 NOTE — Assessment & Plan Note (Signed)
independently reviewed radiographs - avulsion type fracture of 5th metatarsal with interval healing - same noted on ultrasound.  Transition into well supportive shoe from the boot.  Icing, nsaids if needed.  F/u in 2-3 weeks.

## 2015-08-15 NOTE — Progress Notes (Signed)
PCP: Aretta Nip, MD  Subjective:   HPI: Patient is a 25 y.o. female here for left foot injury.  5/25: Patient reports on 5/15 she was walking out the front door, stepped wrong and inverted her left ankle. Immediate pain, swelling lateral left foot. Difficulty bearing weight. Went to ED - placed in boot with crutch for avulsion fracture. Has been icing. Pain level 7/10 and sharp in this area. No skin changes, numbness. No prior injuries.  6/12: Patient reports she has improved since last visit. Pain level 4/10, more dull lateral left foot and dorsal. Some bruising, still swelling. Wearing boot when up and walking around which has helped. Improved with rest. No skin changes, numbness.  Past Medical History  Diagnosis Date  . Bipolar 1 disorder ([place])   . Asthma   . Thyroid ca ([place])   . Seasonal allergies   . Hypersomnia   . OSA (obstructive sleep apnea)   . 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 Depression   . Anxiety   . Narcolepsy     Current Outpatient Prescriptions on File Prior to Visit  Medication Sig Dispense Refill  . albuterol (PROVENTIL HFA;VENTOLIN HFA) 108 (90 BASE) MCG/ACT inhaler Inhale 2 puffs into the lungs every 6 (six) hours as needed for wheezing.    Marland Kitchen ALPRAZolam (XANAX) 0.5 MG tablet   0  . buPROPion (WELLBUTRIN XL) 300 MG 24 hr tablet Take 1 tablet by mouth daily.    . cetirizine (ZYRTEC) 10 MG tablet Take 10 mg by mouth daily.    Marland Kitchen escitalopram (LEXAPRO) 10 MG tablet Take 10 mg by mouth daily.    Marland Kitchen etonogestrel (NEXPLANON) 68 MG IMPL implant Inject 1 each into the skin once.    Marland Kitchen levocetirizine (XYZAL) 5 MG tablet   5  . liothyronine (CYTOMEL) 50 MCG tablet Take 50 mcg by mouth daily.    . methylphenidate (RITALIN LA) 20 MG 24 hr capsule Take 1 capsule (20 mg total) by mouth every morning. 30 capsule 0  . methylphenidate (RITALIN) 10 MG tablet Take 1 tablet (10 mg total) by mouth as needed (excessive daytime sleepiness). 30 tablet 0   . SYNTHROID 150 MCG tablet   3  . traMADol (ULTRAM) 50 MG tablet Take 1 tablet (50 mg total) by mouth every 6 (six) hours as needed. 10 tablet 0   No current facility-administered medications on file prior to visit.    Past Surgical History  Procedure Laterality Date  . Rhinoplasty  2009  . Tonsilectomy, adenoidectomy, bilateral myringotomy and tubes  2009  . Breast surgery  12/2009    lump removed left breast  . Thyroidectomy  12/20/2012  . Thyroid surgery      No Known Allergies  Social History   Social History  . Marital Status: Single    Spouse Name: N/A  . Number of Children: N/A  . Years of Education: N/A   Occupational History  . Nurse Tech   . Student    Social History Main Topics  . Smoking status: Former Smoker -- 1.00 packs/day for 8 years    Types: Cigarettes    Quit date: 04/03/2012  . Smokeless tobacco: Never Used  . Alcohol Use: 0.6 oz/week    1 Glasses of wine per week     Comment: occ  . Drug Use: No  . Sexual Activity: Yes    Birth Control/ Protection: Implant   Other Topics Concern  . Not on file   Social  History Narrative    Family History  Problem Relation Age of Onset  . Cancer Other     GREAT AUNT  . Asthma Brother     BP 122/83 mmHg  Pulse 106  Ht 5\' 8"  (1.727 m)  Wt 252 lb (114.306 kg)  BMI 38.33 kg/m2  Review of Systems: See HPI above.    Objective:  Physical Exam:  Gen: NAD, comfortable in exam room  Left foot/ankle: Mild lateral foot swelling.  No bruising, other deformity. Did not test extent of motion with known fracture. TTP base 5th metatarsal, less 3rd and 4th metatarsals.  No other tenderness. Negative ant drawer and talar tilt.   Negative syndesmotic compression. Thompsons test negative. NV intact distally.  Right foot/ankle: FROM without pain.  MSK u/s:  Cortical irregularity with interval healing proximal 5th metatarsal.  No irregularities of 3rd, 4th metatarsals.    Assessment & Plan:  1. Left  foot injury - independently reviewed radiographs - avulsion type fracture of 5th metatarsal with interval healing - same noted on ultrasound.  Transition into well supportive shoe from the boot.  Icing, nsaids if needed.  F/u in 2-3 weeks.

## 2015-08-16 NOTE — Telephone Encounter (Signed)
Tam, are you okay with refilling her Ritalin 20 mg? Last written on 07/02/15 # 30 with no fills  She wants to pick up  VS not here this wk to sign it  Thanks!

## 2015-08-17 ENCOUNTER — Telehealth: Payer: Self-pay | Admitting: Adult Health

## 2015-08-17 MED ORDER — METHYLPHENIDATE HCL ER (LA) 20 MG PO CP24: 20.0000 mg | ORAL_CAPSULE | ORAL | Status: DC

## 2015-08-17 MED FILL — METHYLPHENIDATE ER 20 MG CA: 20 | 30 days supply | Qty: 30 | Fill #0

## 2015-08-17 NOTE — Telephone Encounter (Signed)
Rosana Berger, CMA at 08/16/2015 1:43 PM     Status: Signed       Expand All Collapse All   Tam, are you okay with refilling her Ritalin 20 mg? Last written on 07/02/15 # 30 with no fills  She wants to pick up  VS not here this wk to sign it  Thanks!       Tammy, are you ok refilling patient's Ritalin in VS absence?

## 2015-08-17 NOTE — Telephone Encounter (Signed)
Yes that is fine

## 2015-08-17 NOTE — Telephone Encounter (Signed)
From  Rosana Berger, CMA   To  [place] and Del[place]  08/16/2015 1:43 PM     Last Read in [place]  08/17/2015 12:18 PM by Clint Bolder     Yes, we will let you know when it is ready to pick up. I am just going to send this to Tammy to get the okay. Have a great day!

## 2015-08-17 NOTE — Telephone Encounter (Signed)
rx printed, signed and left at front for patient to pick up. Patient is aware. Nothing further needed.

## 2015-08-20 DIAGNOSIS — H5213 Myopia, bilateral: Secondary | ICD-10-CM | POA: Diagnosis not present

## 2015-08-20 DIAGNOSIS — F3341 Major depressive disorder, recurrent, in partial remission: Secondary | ICD-10-CM | POA: Diagnosis not present

## 2015-08-23 MED FILL — LEVOCETIRIZINE 5 MG TABLET: 5 | 30 days supply | Qty: 30 | Fill #2

## 2015-08-27 ENCOUNTER — Ambulatory Visit (INDEPENDENT_AMBULATORY_CARE_PROVIDER_SITE_OTHER): Payer: 59 | Admitting: Family Medicine

## 2015-08-27 ENCOUNTER — Encounter: Payer: Self-pay | Admitting: Family Medicine

## 2015-08-27 VITALS — BP 115/81 | HR 83 | Ht 69.0 in | Wt 252.0 lb

## 2015-08-27 DIAGNOSIS — S99922D Unspecified injury of left foot, subsequent encounter: Secondary | ICD-10-CM | POA: Diagnosis not present

## 2015-08-28 NOTE — Progress Notes (Signed)
PCP: Aretta Nip, MD  Subjective:   HPI: Patient is a 25 y.o. female here for left foot injury.  5/25: Patient reports on 5/15 she was walking out the front door, stepped wrong and inverted her left ankle. Immediate pain, swelling lateral left foot. Difficulty bearing weight. Went to ED - placed in boot with crutch for avulsion fracture. Has been icing. Pain level 7/10 and sharp in this area. No skin changes, numbness. No prior injuries.  6/12: Patient reports she has improved since last visit. Pain level 4/10, more dull lateral left foot and dorsal. Some bruising, still swelling. Wearing boot when up and walking around which has helped. Improved with rest. No skin changes, numbness.  6/26: Patient reports she feels much better. Pain level 0/10. Gets some discomfort but not at fracture site when she gets up - resolves quickly. No skin changes, numbness.  Past Medical History  Diagnosis Date  . Bipolar 1 disorder ([place])   . Asthma   . Thyroid ca ([place])   . Seasonal allergies   . Hypersomnia   . OSA (obstructive sleep apnea)   . 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 Depression   . Anxiety   . Narcolepsy     Current Outpatient Prescriptions on File Prior to Visit  Medication Sig Dispense Refill  . albuterol (PROVENTIL HFA;VENTOLIN HFA) 108 (90 BASE) MCG/ACT inhaler Inhale 2 puffs into the lungs every 6 (six) hours as needed for wheezing.    Marland Kitchen ALPRAZolam (XANAX) 0.5 MG tablet   0  . buPROPion (WELLBUTRIN XL) 300 MG 24 hr tablet Take 1 tablet by mouth daily.    . cetirizine (ZYRTEC) 10 MG tablet Take 10 mg by mouth daily.    Marland Kitchen escitalopram (LEXAPRO) 10 MG tablet Take 10 mg by mouth daily.    Marland Kitchen etonogestrel (NEXPLANON) 68 MG IMPL implant Inject 1 each into the skin once.    Marland Kitchen levocetirizine (XYZAL) 5 MG tablet   5  . liothyronine (CYTOMEL) 50 MCG tablet Take 50 mcg by mouth daily.    . methylphenidate (RITALIN LA) 20 MG 24 hr capsule Take 1 capsule (20 mg  total) by mouth every morning. 30 capsule 0  . methylphenidate (RITALIN) 10 MG tablet Take 1 tablet (10 mg total) by mouth as needed (excessive daytime sleepiness). 30 tablet 0  . SYNTHROID 150 MCG tablet   3  . traMADol (ULTRAM) 50 MG tablet Take 1 tablet (50 mg total) by mouth every 6 (six) hours as needed. 10 tablet 0   No current facility-administered medications on file prior to visit.    Past Surgical History  Procedure Laterality Date  . Rhinoplasty  2009  . Tonsilectomy, adenoidectomy, bilateral myringotomy and tubes  2009  . Breast surgery  12/2009    lump removed left breast  . Thyroidectomy  12/20/2012  . Thyroid surgery      No Known Allergies  Social History   Social History  . Marital Status: Single    Spouse Name: N/A  . Number of Children: N/A  . Years of Education: N/A   Occupational History  . Nurse Tech   . Student    Social History Main Topics  . Smoking status: Former Smoker -- 1.00 packs/day for 8 years    Types: Cigarettes    Quit date: 04/03/2012  . Smokeless tobacco: Never Used  . Alcohol Use: 0.6 oz/week    1 Glasses of wine per week     Comment: occ  .  Drug Use: No  . Sexual Activity: Yes    Birth Control/ Protection: Implant   Other Topics Concern  . Not on file   Social History Narrative    Family History  Problem Relation Age of Onset  . Cancer Other     GREAT AUNT  . Asthma Brother     BP 115/81 mmHg  Pulse 83  Ht 5\' 9"  (1.753 m)  Wt 252 lb (114.306 kg)  BMI 37.20 kg/m2  Review of Systems: See HPI above.    Objective:  Physical Exam:  Gen: NAD, comfortable in exam room  Left foot/ankle: Minimal lateral foot swelling.  No bruising, other deformity. FROM without pain. No TTP now. Negative ant drawer and talar tilt.   Negative syndesmotic compression. Thompsons test negative. NV intact distally.  Right foot/ankle: FROM without pain.    Assessment & Plan:  1. Left foot injury - Clinically healed at this  point.  Brief msk u/s shows excellent healing as well.  Tylenol, ibuprofen for any soreness.  F/u prn.

## 2015-08-28 NOTE — Assessment & Plan Note (Signed)
Clinically healed at this point.  Brief msk u/s shows excellent healing as well.  Tylenol, ibuprofen for any soreness.  F/u prn.

## 2015-09-06 MED FILL — BUPROPION HCL XL 300 MG TAB: 300 | 90 days supply | Qty: 90 | Fill #0

## 2015-09-10 MED FILL — LIOTHYRONINE SOD 25 MCG TAB: 25 | 30 days supply | Qty: 30 | Fill #1

## 2015-09-10 MED FILL — SYNTHROID 150 MCG TABLET: 150 | 30 days supply | Qty: 30 | Fill #1

## 2015-09-12 ENCOUNTER — Telehealth: Payer: Self-pay | Admitting: Adult Health

## 2015-09-12 ENCOUNTER — Telehealth: Payer: Self-pay | Admitting: Pulmonary Disease

## 2015-09-12 MED ORDER — METHYLPHENIDATE HCL 10 MG PO TABS: 10.0000 mg | ORAL_TABLET | ORAL | Status: DC | PRN

## 2015-09-12 NOTE — Telephone Encounter (Signed)
Spoke with pt, requesting refill on ritalin 20mg .  Pt would like to pick this up in office when ready. Last refill: Ritalin LA 20mg  #30, 1 po qd with 0 refills, filled on 08/17/2015.    VS please advise on refill.  Thanks!

## 2015-09-12 NOTE — Telephone Encounter (Signed)
Okay to place refill for ritalin 20 mg daily, #30 with 5 refills.

## 2015-09-12 NOTE — Telephone Encounter (Signed)
Called spoke with patient, advised VS okayed the refill on her Ritalin However, VS is not in the office to sign the Rx - he will return to the office on 7.17.17 but pt is unable to wait until then.   Pt asked if TP can sign the Rx > Ritalin 20mg  QD #30 was last refilled by TP on 6.16.17  Tam, is it okay to refill this medication again under your name since VS is unavailable to sign?  Thank you.  Last ov 3.13.17 w/ TP, follow up in 6 months  Last ov w/ VS 1.12.17

## 2015-09-12 NOTE — Telephone Encounter (Signed)
Per verbal order from TP  Okay to refill ritalin  Rx printed, signed, and placed at the front for pick up.  Called spoke with pt. Informed her that prescription is ready for pick up at the front desk. She voiced understanding and had no further questions. Nothing further needed.

## 2015-09-12 NOTE — Telephone Encounter (Signed)
Will print new rx and will address with TP when she returns to office on 09/13/15

## 2015-09-13 MED ORDER — METHYLPHENIDATE HCL ER (LA) 20 MG PO CP24: 20.0000 mg | ORAL_CAPSULE | ORAL | Status: DC

## 2015-09-13 NOTE — Telephone Encounter (Signed)
Pt returning call and I let her know that her rx was ready for pick-up.Sophia West

## 2015-09-13 NOTE — Telephone Encounter (Signed)
LVM for pt to return call. Rx has been printed, signed, and placed at the front for pick up.

## 2015-09-14 MED FILL — METHYLPHENIDATE ER 20 MG CA: 20 | 30 days supply | Qty: 30 | Fill #0

## 2015-09-14 NOTE — Telephone Encounter (Signed)
Noted. Will close this encounter.  

## 2015-09-19 DIAGNOSIS — Z309 Encounter for contraceptive management, unspecified: Secondary | ICD-10-CM | POA: Diagnosis not present

## 2015-10-08 DIAGNOSIS — Z4589 Encounter for adjustment and management of other implanted devices: Secondary | ICD-10-CM | POA: Diagnosis not present

## 2015-10-08 DIAGNOSIS — Z3040 Encounter for surveillance of contraceptives, unspecified: Secondary | ICD-10-CM | POA: Diagnosis not present

## 2015-10-15 MED FILL — LIOTHYRONINE SOD 25 MCG TAB: 25 | 30 days supply | Qty: 30 | Fill #2

## 2015-10-15 MED FILL — SYNTHROID 150 MCG TABLET: 150 | 30 days supply | Qty: 30 | Fill #2

## 2015-10-21 MED FILL — LEVOCETIRIZINE 5 MG TABLET: 5 | 30 days supply | Qty: 30 | Fill #3

## 2015-10-22 DIAGNOSIS — J329 Chronic sinusitis, unspecified: Secondary | ICD-10-CM | POA: Diagnosis not present

## 2015-11-09 ENCOUNTER — Telehealth: Payer: Self-pay | Admitting: Pulmonary Disease

## 2015-11-09 NOTE — Telephone Encounter (Signed)
lmtcb for pt.  

## 2015-11-12 NOTE — Telephone Encounter (Signed)
lmtcb x2 for pt. 

## 2015-11-12 NOTE — Telephone Encounter (Signed)
Pt returning call about med refill and can be reached @ [phone].Sophia West

## 2015-11-12 NOTE — Telephone Encounter (Signed)
OK to refill

## 2015-11-12 NOTE — Telephone Encounter (Signed)
Spoke with pt. She is needing a refill on Ritalin 20mg . This was last refilled by TP on 09/13/15 #30. Her last OV was on 05/14/15 with TP.  BQ - please advise if you would be willing refill as VS is working 11pm El[place]. Thanks.

## 2015-11-12 NOTE — Telephone Encounter (Signed)
Lvm for pt

## 2015-11-13 DIAGNOSIS — Z23 Encounter for immunization: Secondary | ICD-10-CM | POA: Diagnosis not present

## 2015-11-13 MED ORDER — METHYLPHENIDATE HCL ER (LA) 20 MG PO CP24: 20.0000 mg | ORAL_CAPSULE | ORAL | 0 refills | Status: DC

## 2015-11-13 MED FILL — LIOTHYRONINE SOD 25 MCG TAB: 25 | 30 days supply | Qty: 30 | Fill #3

## 2015-11-13 MED FILL — METHYLPHENIDATE ER 20 MG CA: 20 | 30 days supply | Qty: 30 | Fill #0

## 2015-11-13 MED FILL — SYNTHROID 150 MCG TABLET: 150 | 30 days supply | Qty: 30 | Fill #3

## 2015-11-13 NOTE — Telephone Encounter (Signed)
Spoke with pt and informed that rx will be ready for pick up this afternoon.  Rx printed and left for BQ to sign and leave at front desk .

## 2015-11-13 NOTE — Telephone Encounter (Signed)
Pt returning call and can be reached @ same.Stanley A Dalton ° °

## 2015-11-20 DIAGNOSIS — Z30431 Encounter for routine checking of intrauterine contraceptive device: Secondary | ICD-10-CM | POA: Diagnosis not present

## 2015-11-23 DIAGNOSIS — S80812A Abrasion, left lower leg, initial encounter: Secondary | ICD-10-CM | POA: Diagnosis not present

## 2015-11-23 MED FILL — MUPIROCIN 2% OINTMENT: 2 | 10 days supply | Qty: 22 | Fill #0

## 2015-12-10 ENCOUNTER — Ambulatory Visit: Payer: Self-pay | Admitting: Pulmonary Disease

## 2015-12-11 ENCOUNTER — Encounter: Payer: Self-pay | Admitting: Pulmonary Disease

## 2015-12-11 ENCOUNTER — Ambulatory Visit (INDEPENDENT_AMBULATORY_CARE_PROVIDER_SITE_OTHER): Payer: 59 | Admitting: Pulmonary Disease

## 2015-12-11 VITALS — BP 122/78 | HR 98 | Ht 67.0 in | Wt 243.8 lb

## 2015-12-11 DIAGNOSIS — G47419 Narcolepsy without cataplexy: Secondary | ICD-10-CM | POA: Diagnosis not present

## 2015-12-11 MED ORDER — METHYLPHENIDATE HCL ER (LA) 20 MG PO CP24: 20.0000 mg | ORAL_CAPSULE | ORAL | 0 refills | Status: DC

## 2015-12-11 MED ORDER — METHYLPHENIDATE HCL 10 MG PO TABS: 10.0000 mg | ORAL_TABLET | ORAL | 0 refills | Status: AC | PRN

## 2015-12-11 NOTE — Progress Notes (Signed)
Current Outpatient Prescriptions on File Prior to Visit  Medication Sig  . albuterol (PROVENTIL HFA;VENTOLIN HFA) 108 (90 BASE) MCG/ACT inhaler Inhale 2 puffs into the lungs every 6 (six) hours as needed for wheezing.  Marland Kitchen buPROPion (WELLBUTRIN XL) 300 MG 24 hr tablet Take 1 tablet by mouth daily.  . cetirizine (ZYRTEC) 10 MG tablet Take 10 mg by mouth daily.  Marland Kitchen levocetirizine (XYZAL) 5 MG tablet   . liothyronine (CYTOMEL) 50 MCG tablet Take 50 mcg by mouth daily.  . methylphenidate (RITALIN LA) 20 MG 24 hr capsule Take 1 capsule (20 mg total) by mouth every morning.  Marland Kitchen SYNTHROID 150 MCG tablet    No current facility-administered medications on file prior to visit.      Chief Complaint  Patient presents with  . Sleep Apnea    PT no longer using CPAP due to recurrent sinus infections.     Tests HST 03/12/14 >> AHI 5.2, SpO2 low 88%  Past medical history Anxiety, Bipolar, Depression, Asthma, Hurthle cell thyroid carcinoma, Allergies  Past surgical history, Family history, Social history, Allergies reviewed  Vital Signs BP 122/78 (BP Location: Right Arm, Cuff Size: Normal)   Pulse 98   Ht 5\' 7"  (1.702 m)   Wt 243 lb 12.8 oz (110.6 kg)   SpO2 98%   BMI 38.18 kg/m   History of Present Illness Sophia West is a 25 y.o. female with narcolepsy w/o cataplexy.  She was not able to tolerate CPAP >> caused recurrent sinus infections.  She didn't feel CPAP helped much anyway.  She is in nursing school.  As along as she takes ritalin she doesn't have any trouble keeping up with activity during the day.  She goes to bed at 10 pm and wakes up at 630 am.  She takes ritalin first thing in morning, and doesn't need to take afternoon dose.  She occasionally drinks coffee in the afternoon.   Physical Exam  General - No distress ENT - No sinus tenderness, no oral exudate, no LAN, MP 3, scalloped tongue Cardiac - s1s2 regular, no murmur Chest - No wheeze/rales/dullness Back - No focal  tenderness Abd - Soft, non-tender Ext - No edema Neuro - Normal strength, no tremor Skin - No rashes Psych - normal mood, and behavior   Assessment/Plan  Narcolepsy w/o cataplexy. - continue ritalin  Obstructive sleep apnea. - intolerant of CPAP due to recurrent sinus infections - if her sleep symptoms get worse, then could consider oral appliance >> defer for now since sleep apnea was very mild   Patient Instructions  Follow up in 6 months    Chesley Mires, MD [facility] Pager:  [phone] 12/11/2015, 4:06 PM

## 2015-12-11 NOTE — Addendum Note (Signed)
Addended by: Chesley Mires on: 12/11/2015 04:12 PM   Modules accepted: Orders

## 2015-12-11 NOTE — Patient Instructions (Signed)
Follow up in 6 months 

## 2015-12-17 MED FILL — SYNTHROID 150 MCG TABLET: 150 | 30 days supply | Qty: 30 | Fill #4

## 2015-12-17 MED FILL — LIOTHYRONINE SOD 25 MCG TAB: 25 | 30 days supply | Qty: 30 | Fill #4

## 2016-01-01 DIAGNOSIS — L570 Actinic keratosis: Secondary | ICD-10-CM | POA: Diagnosis not present

## 2016-01-01 DIAGNOSIS — L438 Other lichen planus: Secondary | ICD-10-CM | POA: Diagnosis not present

## 2016-01-01 DIAGNOSIS — X32XXXA Exposure to sunlight, initial encounter: Secondary | ICD-10-CM | POA: Diagnosis not present

## 2016-01-07 MED FILL — METHYLPHENIDATE ER 20 MG CA: 20 | 30 days supply | Qty: 30 | Fill #0

## 2016-01-11 DIAGNOSIS — C73 Malignant neoplasm of thyroid gland: Secondary | ICD-10-CM | POA: Diagnosis not present

## 2016-01-11 DIAGNOSIS — Z08 Encounter for follow-up examination after completed treatment for malignant neoplasm: Secondary | ICD-10-CM | POA: Diagnosis not present

## 2016-01-11 DIAGNOSIS — J45909 Unspecified asthma, uncomplicated: Secondary | ICD-10-CM | POA: Diagnosis not present

## 2016-01-11 DIAGNOSIS — Z79899 Other long term (current) drug therapy: Secondary | ICD-10-CM | POA: Diagnosis not present

## 2016-01-11 DIAGNOSIS — Z87891 Personal history of nicotine dependence: Secondary | ICD-10-CM | POA: Diagnosis not present

## 2016-01-11 DIAGNOSIS — Z6838 Body mass index (BMI) 38.0-38.9, adult: Secondary | ICD-10-CM | POA: Diagnosis not present

## 2016-01-11 DIAGNOSIS — E669 Obesity, unspecified: Secondary | ICD-10-CM | POA: Diagnosis not present

## 2016-01-11 DIAGNOSIS — Z9889 Other specified postprocedural states: Secondary | ICD-10-CM | POA: Diagnosis not present

## 2016-01-11 DIAGNOSIS — Z85850 Personal history of malignant neoplasm of thyroid: Secondary | ICD-10-CM | POA: Diagnosis not present

## 2016-01-14 MED FILL — LIOTHYRONINE SOD 25 MCG TAB: 25 | 30 days supply | Qty: 30 | Fill #5

## 2016-01-14 MED FILL — SYNTHROID 150 MCG TABLET: 150 | 30 days supply | Qty: 30 | Fill #5

## 2016-01-14 MED FILL — LEVOCETIRIZINE 5 MG TABLET: 5 | 30 days supply | Qty: 30 | Fill #4

## 2016-02-13 DIAGNOSIS — F3341 Major depressive disorder, recurrent, in partial remission: Secondary | ICD-10-CM | POA: Diagnosis not present

## 2016-02-13 MED FILL — BUPROPION HCL XL 300 MG TAB: 300 | 90 days supply | Qty: 90 | Fill #1

## 2016-02-13 MED FILL — ALPRAZolam 0.5 MG TABS: 0.5 | 20 days supply | Qty: 60 | Fill #0

## 2016-02-19 MED FILL — SYNTHROID 150 MCG TABLET: 150 | 30 days supply | Qty: 30 | Fill #6

## 2016-02-26 ENCOUNTER — Telehealth: Payer: Self-pay | Admitting: Pulmonary Disease

## 2016-02-26 MED ORDER — METHYLPHENIDATE HCL ER (LA) 20 MG PO CP24: 20.0000 mg | ORAL_CAPSULE | ORAL | 0 refills | Status: DC

## 2016-02-26 NOTE — Telephone Encounter (Signed)
Pt is requesting a refill on Ritalin 20mg . Last OV with VS was on 12/11/15. Last refill was on 12/11/15 #30. No pending OVs at this time.  MW - please advise if you would be willing to refill as VS is on vacation. Thanks.

## 2016-02-26 NOTE — Telephone Encounter (Signed)
Ok  X one, further refills per Dr Halford Chessman

## 2016-02-26 NOTE — Telephone Encounter (Signed)
rx printed, signed by MW, and placed up front for pickup.  Pt aware.  Nothing further needed.

## 2016-03-04 MED FILL — METHYLPHENIDATE ER 20 MG CA: 20 | 30 days supply | Qty: 30 | Fill #0

## 2016-04-09 DIAGNOSIS — X32XXXD Exposure to sunlight, subsequent encounter: Secondary | ICD-10-CM | POA: Diagnosis not present

## 2016-04-09 DIAGNOSIS — L570 Actinic keratosis: Secondary | ICD-10-CM | POA: Diagnosis not present

## 2016-04-10 MED FILL — TRIAMCINOLONE 0.1% CREAM: 0.1 | 10 days supply | Qty: 15 | Fill #0

## 2016-04-22 MED FILL — LIOTHYRONINE SOD 25 MCG TAB: 25 | 30 days supply | Qty: 30 | Fill #6

## 2016-04-22 MED FILL — SYNTHROID 150 MCG TABLET: 150 | 30 days supply | Qty: 30 | Fill #7

## 2016-05-27 MED FILL — SYNTHROID 150 MCG TABLET: 150 | 30 days supply | Qty: 30 | Fill #8

## 2016-06-19 MED FILL — LIOTHYRONINE SOD 25 MCG TAB: 25 | 30 days supply | Qty: 30 | Fill #7

## 2016-07-01 MED FILL — SYNTHROID 150 MCG TABLET: 150 | 30 days supply | Qty: 30 | Fill #9

## 2016-07-11 DIAGNOSIS — G47419 Narcolepsy without cataplexy: Secondary | ICD-10-CM | POA: Diagnosis not present

## 2016-07-11 DIAGNOSIS — E559 Vitamin D deficiency, unspecified: Secondary | ICD-10-CM | POA: Diagnosis not present

## 2016-07-11 DIAGNOSIS — J309 Allergic rhinitis, unspecified: Secondary | ICD-10-CM | POA: Diagnosis not present

## 2016-07-11 DIAGNOSIS — E890 Postprocedural hypothyroidism: Secondary | ICD-10-CM | POA: Diagnosis not present

## 2016-07-11 DIAGNOSIS — Z85850 Personal history of malignant neoplasm of thyroid: Secondary | ICD-10-CM | POA: Diagnosis not present

## 2016-07-11 DIAGNOSIS — Z6835 Body mass index (BMI) 35.0-35.9, adult: Secondary | ICD-10-CM | POA: Diagnosis not present

## 2016-07-11 DIAGNOSIS — E669 Obesity, unspecified: Secondary | ICD-10-CM | POA: Diagnosis not present

## 2016-07-11 DIAGNOSIS — Z0000 Encounter for general adult medical examination without abnormal findings: Secondary | ICD-10-CM | POA: Diagnosis not present

## 2016-07-11 DIAGNOSIS — F319 Bipolar disorder, unspecified: Secondary | ICD-10-CM | POA: Diagnosis not present

## 2016-07-30 DIAGNOSIS — F3341 Major depressive disorder, recurrent, in partial remission: Secondary | ICD-10-CM | POA: Diagnosis not present

## 2016-07-31 MED FILL — SYNTHROID 150 MCG TABLET: 150 | 30 days supply | Qty: 30 | Fill #10

## 2016-08-22 MED FILL — LIOTHYRONINE SOD 25 MCG TAB: 25 | 30 days supply | Qty: 30 | Fill #0

## 2016-09-05 MED FILL — SYNTHROID 150 MCG TABLET: 150 | 30 days supply | Qty: 30 | Fill #0

## 2016-09-16 DIAGNOSIS — Z113 Encounter for screening for infections with a predominantly sexual mode of transmission: Secondary | ICD-10-CM | POA: Diagnosis not present

## 2016-09-30 DIAGNOSIS — H1031 Unspecified acute conjunctivitis, right eye: Secondary | ICD-10-CM | POA: Diagnosis not present

## 2016-09-30 MED FILL — POLYMYXIN B/TMP EYE DROPS: 10000-0.1 | 30 days supply | Qty: 10 | Fill #0

## 2016-10-09 MED FILL — SYNTHROID 150 MCG TABLET: 150 | 30 days supply | Qty: 30 | Fill #1

## 2016-10-22 MED FILL — LIOTHYRONINE SODIUM 25 MCG: 25 | 60 days supply | Qty: 30 | Fill #1

## 2016-10-24 DIAGNOSIS — H5213 Myopia, bilateral: Secondary | ICD-10-CM | POA: Diagnosis not present

## 2016-11-10 MED FILL — SYNTHROID 150 MCG TABLET: 150 | 30 days supply | Qty: 30 | Fill #2

## 2016-12-08 MED FILL — SYNTHROID 150 MCG TABLET: 150 | 30 days supply | Qty: 30 | Fill #3

## 2016-12-11 DIAGNOSIS — N390 Urinary tract infection, site not specified: Secondary | ICD-10-CM | POA: Diagnosis not present

## 2016-12-11 DIAGNOSIS — R350 Frequency of micturition: Secondary | ICD-10-CM | POA: Diagnosis not present

## 2016-12-11 DIAGNOSIS — M545 Low back pain: Secondary | ICD-10-CM | POA: Diagnosis not present

## 2016-12-11 MED FILL — MELOXICAM 15 MG TABLET: 15 | 30 days supply | Qty: 30 | Fill #0

## 2016-12-15 MED FILL — CIPROFLOXACIN HCL 500 MG TA: 500 | 7 days supply | Qty: 14 | Fill #0

## 2016-12-17 ENCOUNTER — Ambulatory Visit (HOSPITAL_COMMUNITY)
Admission: EM | Admit: 2016-12-17 | Discharge: 2016-12-17 | Disposition: A | Discharge status: Home / Self Care | Payer: 59 | Attending: Family Medicine | Admitting: Family Medicine

## 2016-12-17 ENCOUNTER — Encounter (HOSPITAL_COMMUNITY): Payer: Self-pay | Admitting: Family Medicine

## 2016-12-17 DIAGNOSIS — R110 Nausea: Secondary | ICD-10-CM | POA: Diagnosis not present

## 2016-12-17 DIAGNOSIS — R51 Headache: Secondary | ICD-10-CM

## 2016-12-17 DIAGNOSIS — R519 Headache, unspecified: Secondary | ICD-10-CM

## 2016-12-17 MED ORDER — KETOROLAC TROMETHAMINE 60 MG/2ML IM SOLN
INTRAMUSCULAR | Status: AC
  Filled 2016-12-17: qty 2

## 2016-12-17 MED ORDER — KETOROLAC TROMETHAMINE 60 MG/2ML IM SOLN
60.0000 mg | Freq: Once | INTRAMUSCULAR | Status: AC
  Administered 2016-12-17: 60 mg via INTRAMUSCULAR

## 2016-12-17 NOTE — ED Triage Notes (Signed)
Pt here for HA since 4:30 today with nausea. Points to temples when she describes location of the pain.

## 2016-12-17 NOTE — Discharge Instructions (Signed)
Please follow up tomorrow morning if not improving. If you feel that your headache is worsening please proceed to the Emergency Department for evaluation.

## 2016-12-20 NOTE — ED Provider Notes (Signed)
  [facility]   [id number] 12/17/16 Arrival Time: 1904  ASSESSMENT & PLAN:  1. Acute nonintractable headache, unspecified headache type     Meds ordered this encounter  Medications  . ketorolac (TORADOL) injection 60 mg   ED if worsening. May f/u tomorrow am here for recheck.  Reviewed expectations re: course of current medical issues. Questions answered. Outlined signs and symptoms indicating need for more acute intervention. Patient verbalized understanding. After Visit Summary given.   SUBJECTIVE:  SAHILY West is a 26 y.o. female who presents with complaint of a headache without aura. Reports abrupt onset a few hours ago. Location: bilateral, temporal. History of similar in the past. Throbbing in nature. Mild nausea without emesis. No recent illnesses. No OTC treatment. Ambulatory. No extremity sensation changes or weakness.  ROS: As per HPI.   OBJECTIVE:  Vitals:   12/17/16 1927  BP: (!) 141/94  Pulse: 88  Resp: 18  Temp: 98.5 F (36.9 C)  SpO2: 100%    General appearance: alert; no distress; appears fatigued Eyes: PERRLA; EOMI; conjunctiva normal HENT: normocephalic; atraumatic Neck: supple with FROM Lungs: clear to auscultation bilaterally Heart: regular rate and rhythm Extremities: no edema; symmetrical with no gross deformities Skin: warm and dry Neurologic: CN 2-12 grossly intact; normal gait; normal symmetric reflexes; normal extremity strength and sensation throughout Psychological: alert and cooperative; normal mood and affect  No Known Allergies  Past Medical History:  Diagnosis Date  . Anxiety   . Asthma   . Bipolar 1 disorder ([place])   . Depression   . 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 Hypersomnia   . Narcolepsy   . OSA (obstructive sleep apnea)   . Seasonal allergies   . Thyroid ca [facility])    Social History   Social History  . Marital status: Single    Spouse name: N/A  . Number of children: N/A  . Years of  education: N/A   Occupational History  . Nurse Tech   . Student    Social History Main Topics  . Smoking status: Former Smoker    Packs/day: 1.00    Years: 8.00    Types: Cigarettes    Quit date: 04/03/2012  . Smokeless tobacco: Never Used  . Alcohol use 0.6 oz/week    1 Glasses of wine per week     Comment: occ  . Drug use: No  . Sexual activity: Yes    Birth control/ protection: Implant   Other Topics Concern  . Not on file   Social History Narrative  . No narrative on file   Family History  Problem Relation Age of Onset  . Cancer Other        GREAT AUNT  . Asthma Brother    Past Surgical History:  Procedure Laterality Date  . BREAST SURGERY  12/2009   lump removed left breast  . RHINOPLASTY  2009  . THYROID SURGERY    . THYROIDECTOMY  12/20/2012  . TONSILECTOMY, ADENOIDECTOMY, BILATERAL MYRINGOTOMY AND TUBES  2009     Vanessa Kick, MD 12/20/16 1209

## 2016-12-24 MED FILL — LIOTHYRONINE SODIUM 25 MCG: 25 | 60 days supply | Qty: 30 | Fill #2

## 2017-01-09 DIAGNOSIS — C73 Malignant neoplasm of thyroid gland: Secondary | ICD-10-CM | POA: Diagnosis not present

## 2017-01-12 MED FILL — SYNTHROID 150 MCG TABLET: 150 | 30 days supply | Qty: 30 | Fill #4

## 2017-01-20 DIAGNOSIS — C73 Malignant neoplasm of thyroid gland: Secondary | ICD-10-CM | POA: Insufficient documentation

## 2017-02-12 MED FILL — SYNTHROID 150 MCG TABLET: 150 | 30 days supply | Qty: 30 | Fill #5

## 2017-02-13 MED FILL — LIOTHYRONINE SODIUM 25 MCG: 25 | 60 days supply | Qty: 30 | Fill #3

## 2017-03-05 MED FILL — predniSONE 10 MG (21) TBPK: 10 | 6 days supply | Qty: 21 | Fill #0

## 2017-03-05 MED FILL — VENTOLIN HFA 90 MCG INHALER: 108 (90 BAS | 16 days supply | Qty: 18 | Fill #0

## 2017-03-18 MED FILL — SYNTHROID 150 MCG TABLET: 150 | 30 days supply | Qty: 30 | Fill #6

## 2017-04-15 MED FILL — SYNTHROID 150 MCG TABLET: 150 | 30 days supply | Qty: 30 | Fill #7

## 2017-04-15 MED FILL — LIOTHYRONINE SODIUM 25 MCG: 25 | 60 days supply | Qty: 30 | Fill #4

## 2017-05-18 MED FILL — SYNTHROID 150 MCG TABLET: 150 | 30 days supply | Qty: 30 | Fill #8

## 2017-06-18 MED FILL — LIOTHYRONINE SODIUM 25 MCG: 25 | 60 days supply | Qty: 30 | Fill #5

## 2017-06-18 MED FILL — SYNTHROID 150 MCG TABLET: 150 | 30 days supply | Qty: 30 | Fill #9

## 2017-07-24 MED FILL — SYNTHROID 150 MCG TABLET: 150 | 30 days supply | Qty: 30 | Fill #10

## 2017-07-30 MED FILL — METHYLPHENIDATE 10 MG TAB: 10 | 30 days supply | Qty: 30 | Fill #0

## 2017-08-25 MED FILL — SYNTHROID 150 MCG TABLET: 150 | 30 days supply | Qty: 30 | Fill #11

## 2017-08-25 MED FILL — LIOTHYRONINE SODIUM 25 MCG: 25 | 60 days supply | Qty: 30 | Fill #0

## 2017-09-28 MED FILL — SYNTHROID 150 MCG TABLET: 150 | 30 days supply | Qty: 30 | Fill #0

## 2017-10-15 NOTE — Progress Notes (Signed)
Patient ID: Sophia West, female   DOB: November 02, 1990, 27 y.o.   MRN: [id number]           Referring Physician: Milagros Evener  Reason for Appointment:  Hypothyroidism, new visit    History of Present Illness:   Hypothyroidism was first diagnosed  after thyroidectomy in 2014           She has been managed by an endocrine surgeon at [facility] The patient has been treated with  initially Synthroid and about a year later she was given Cytomel in addition This is because she was complaining of moodiness and lack of motivation She thinks she felt a little better with using Cytomel in addition  She takes her thyroid supplements early morning on a daily basis If she is working her night shift she will take her medication right before she is leaving in the morning and this is usually empty stomach         Patient's weight history is as follows:  Wt Readings from Last 3 Encounters:  10/16/17 193 lb 9.6 oz (87.8 kg)  12/11/15 243 lb 12.8 oz (110.6 kg)  08/27/15 252 lb (114.3 kg)    Thyroid function results have been as follows, no labs available since 11/18:  Date   free T4   TSH   T3   01/09/2017  0.9  0.08  114  01/11/2016  0.8  0.054  201  08/13/2015  0.9  0.09  276  02/14/2015 0.7  0.15  148             Lab Results  Component Value Date   TSH 1.23 10/16/2017   FREET4 0.84 10/16/2017   T3FREE 3.7 10/16/2017    THYROID cancer:  She had an incidental finding of a thyroid nodule on the right side in 2014 when being seen by her PCP Biopsy the lesion and she went to [facility] for a second opinion She was recommended thyroidectomy  Surgical pathology showed minimally invasive follicular thyroid cancer, 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 Anxiety   . Asthma   . Bipolar 1 disorder ([place])   . Depression   . 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 Hypersomnia   . Narcolepsy   . OSA (obstructive sleep apnea)   . Seasonal allergies   . Thyroid ca [facility])     Past Surgical History:  Procedure Laterality Date  . BREAST SURGERY  12/2009   lump removed left breast  . RHINOPLASTY  2009  . THYROID SURGERY    . THYROIDECTOMY  12/20/2012  . TONSILECTOMY, ADENOIDECTOMY, BILATERAL MYRINGOTOMY AND TUBES  2009    Family History  Problem Relation Age of Onset  . Thyroid disease Mother   . Diabetes Mother   . Bipolar disorder Mother   . Bipolar disorder Father   . Cancer Other        GREAT AUNT, breast  . Asthma Brother   . Bipolar disorder Brother     Social History:  reports that she quit smoking about 5 years ago. Her smoking use included cigarettes. She has a 8.00 pack-year smoking history. She has never used smokeless tobacco. She reports  that she drinks about 1.0 standard drinks of alcohol per week. She reports that she does not use drugs.  Allergies: No Known Allergies  Allergies as of 10/16/2017   No Known Allergies     Medication List        Accurate as of 10/16/17 11:59 PM. Always use your most recent med list.          albuterol 108 (90 Base) MCG/ACT inhaler Commonly known as:  PROVENTIL HFA;VENTOLIN HFA Inhale 2 puffs into the lungs every 6 (six) hours as needed for wheezing.   cetirizine 10 MG tablet Commonly known as:  ZYRTEC Take 10 mg by mouth daily.   levocetirizine 5 MG tablet Commonly known as:  XYZAL   liothyronine 50 MCG tablet Commonly known as:  CYTOMEL Take 12.5 mcg by mouth daily.   methylphenidate 10 MG tablet Commonly known as:  RITALIN Take 1 tablet (10 mg total) by mouth as needed (excessive daytime sleepiness).   methylphenidate 20 MG 24 hr capsule Commonly known as:  RITALIN LA Take 1 capsule (20 mg total) by  mouth every morning.   SKYLA 13.5 MG Iud Generic drug:  Levonorgestrel by Intrauterine route.   SYNTHROID 150 MCG tablet Generic drug:  levothyroxine          Review of Systems  Constitutional: Positive for weight loss and diaphoresis.       Over the last year and a half she has been able to lose about 70 pounds, recently only small amounts  HENT: Negative for hoarseness and trouble swallowing.   Respiratory: Negative for shortness of breath.   Cardiovascular: Negative for palpitations.  Gastrointestinal: Negative for diarrhea.  Endocrine: Positive for fatigue and heat intolerance.  Genitourinary: Negative for frequency.  Musculoskeletal: Negative for joint pain.  Neurological: Negative for weakness.       Reportedly has narcolepsy and will take Ritalin as needed  Psychiatric/Behavioral: Positive for depressed mood, nervousness and insomnia.                Examination:    BP 118/78 (BP Location: Left Arm, Patient Position: Sitting, Cuff Size: Normal)   Pulse 81   Ht 5\' 8"  (1.727 m)   Wt 193 lb 9.6 oz (87.8 kg)   SpO2 97%   BMI 29.44 kg/m   GENERAL:  Relatively large build.  Mild generalized obesity present, no cushingoid features  No pallor, clubbing of nails, or edema.  Skin:  no rash or pigmentation.  EYES:  No prominence of the eyes or swelling of the eyelids  ENT: Oral mucosa and tongue normal.  THYROID:  Not palpable.  No other mass palpable in the thyroid area, scar of thyroidectomy present No cervical lymphadenopathy present  HEART:  Normal  S1 and S2; no murmur or click.  CHEST:    Lungs: Vescicular breath sounds heard equally.  No crepitations/ wheeze.  ABDOMEN:  No distention.  Liver and spleen not palpable.  No other mass or tenderness.  NEUROLOGICAL: Reflexes are bilaterally normal to slightly brisk at biceps.  JOINTS:  Normal peripheral joints.   Assessment:  HYPOTHYROIDISM, postsurgical since 2014  She has been managed by an  endocrine surgeon previously with a combination of Synthroid and Cytomel, currently using 150 mcg of Synthroid and 12.5 of Cytomel However she has had a very low TSH consistently since 2016 occasionally with associated high T3 levels Since her history of thyroid cancer is over 19 years old and was only minimally invasive as well as treated  very aggressively with total thyroidectomy and I-131 treatment she does not need to have her TSH suppressed  Discussed long-term adverse effects of excessive thyroid hormone supplementation including bone health and potential cardiac effects Also potentially her mild excessive replacement of thyroid supplements may worsen her anxiety  THYROID cancer: As above this was treated surgically and with I-131 for a minimally invasive follicular thyroid cancer in 2014 No evidence of recurrence with use the persistently low thyroglobulin levels although these have been checked when TSH has been near 0  Bipolar disorder with recently recurrence of symptoms of depression and anxiety and difficulty with sleep  PLAN:  Check thyroid levels today Discussed that if she still has TSH in the hyperthyroid range will need to reduce her thyroid supplements at least gradually to keep her TSH at least above the lower limit  Also recommended that if her thyroid level is not indicating inadequate supplementation she will need to be treated with antidepressants again because of her long history of bipolar disorder and recurrence of symptoms   Follow-up to be decided, most likely in about 2 months after adjustment of her thyroid levels   Elayne Snare 10/17/2017, 6:06 PM   Consultation note copy sent to the PCP  Note: This office note was prepared with Dragon voice recognition system technology. Any transcriptional errors that result from this process are unintentional.  Addendum: Thyroid levels indicate very normal levels of TSH of 1.2 along with normal T4 and free T3  levels and she  can continue the same regimen She can follow-up in 4 months and will recheck thyroglobulin at that time Patient my chart message sent  Elayne Snare

## 2017-10-16 ENCOUNTER — Encounter: Payer: Self-pay | Admitting: Endocrinology

## 2017-10-16 ENCOUNTER — Ambulatory Visit: Payer: No Typology Code available for payment source | Admitting: Endocrinology

## 2017-10-16 VITALS — BP 118/78 | HR 81 | Ht 68.0 in | Wt 193.6 lb

## 2017-10-16 DIAGNOSIS — F3131 Bipolar disorder, current episode depressed, mild: Secondary | ICD-10-CM | POA: Diagnosis not present

## 2017-10-16 DIAGNOSIS — E890 Postprocedural hypothyroidism: Secondary | ICD-10-CM

## 2017-10-16 DIAGNOSIS — C73 Malignant neoplasm of thyroid gland: Secondary | ICD-10-CM | POA: Diagnosis not present

## 2017-10-16 LAB — TSH: TSH: 1.23 u[IU]/mL (ref 0.35–4.50)

## 2017-10-16 LAB — T3, FREE: T3, Free: 3.7 pg/mL (ref 2.3–4.2)

## 2017-10-16 LAB — T4, FREE: Free T4: 0.84 ng/dL (ref 0.60–1.60)

## 2017-10-17 ENCOUNTER — Encounter: Payer: Self-pay | Admitting: Endocrinology

## 2017-10-17 DIAGNOSIS — F3131 Bipolar disorder, current episode depressed, mild: Secondary | ICD-10-CM | POA: Insufficient documentation

## 2017-10-17 DIAGNOSIS — E890 Postprocedural hypothyroidism: Secondary | ICD-10-CM | POA: Insufficient documentation

## 2017-11-04 MED FILL — SYNTHROID 150 MCG TABLET: 150 | 30 days supply | Qty: 30 | Fill #1

## 2017-11-10 ENCOUNTER — Other Ambulatory Visit: Payer: Self-pay | Admitting: Endocrinology

## 2017-11-10 MED ORDER — SYNTHROID 175 MCG PO TABS: 175.0000 ug | ORAL_TABLET | Freq: Every day | ORAL | 2 refills | Status: DC

## 2017-11-19 MED FILL — METHYLPHENIDATE 10 MG TAB: 10 | 30 days supply | Qty: 30 | Fill #0

## 2017-12-04 DIAGNOSIS — C73 Malignant neoplasm of thyroid gland: Secondary | ICD-10-CM | POA: Diagnosis not present

## 2017-12-04 MED FILL — SYNTHROID 150 MCG TABLET: 150 | 30 days supply | Qty: 30 | Fill #2

## 2017-12-07 ENCOUNTER — Ambulatory Visit: Payer: Self-pay | Admitting: Endocrinology

## 2017-12-14 MED FILL — SYNTHROID 175 MCG TABLET: 175 | 30 days supply | Qty: 30 | Fill #0

## 2018-01-14 MED FILL — NICOTINE 7 MG/24HR PATCH: 7 | 28 days supply | Qty: 28 | Fill #0

## 2018-01-17 MED FILL — LIOTHYRONINE SODIUM 25 MCG: 25 | 60 days supply | Qty: 30 | Fill #1

## 2018-01-18 MED FILL — SYNTHROID 200 MCG TABLET: 200 | 30 days supply | Qty: 30 | Fill #0

## 2018-01-20 MED FILL — ALPRAZolam 0.5 MG TABS: 0.5 | 30 days supply | Qty: 30 | Fill #0

## 2018-02-15 MED FILL — SYNTHROID 200 MCG TABLET: 200 | 30 days supply | Qty: 30 | Fill #1

## 2018-02-19 MED FILL — LIOTHYRONINE SODIUM 25 MCG: 25 | 90 days supply | Qty: 90 | Fill #0

## 2018-03-15 MED FILL — SYNTHROID 200 MCG TABLET: 200 | 30 days supply | Qty: 30 | Fill #2

## 2018-03-17 DIAGNOSIS — F410 Panic disorder [episodic paroxysmal anxiety] without agoraphobia: Secondary | ICD-10-CM | POA: Diagnosis not present

## 2018-04-06 MED FILL — ALPRAZolam 0.5 MG TABS: 0.5 | 30 days supply | Qty: 30 | Fill #0

## 2018-04-09 DIAGNOSIS — Z202 Contact with and (suspected) exposure to infections with a predominantly sexual mode of transmission: Secondary | ICD-10-CM | POA: Diagnosis not present

## 2018-04-09 MED FILL — metroNIDAZOLE 500 MG TABS: 500 | 1 days supply | Qty: 4 | Fill #0

## 2018-04-14 MED FILL — SYNTHROID 200 MCG TABLET: 200 | 30 days supply | Qty: 30 | Fill #3

## 2018-05-06 DIAGNOSIS — F1210 Cannabis abuse, uncomplicated: Secondary | ICD-10-CM | POA: Diagnosis not present

## 2018-05-06 DIAGNOSIS — F411 Generalized anxiety disorder: Secondary | ICD-10-CM | POA: Diagnosis not present

## 2018-05-06 DIAGNOSIS — F330 Major depressive disorder, recurrent, mild: Secondary | ICD-10-CM | POA: Diagnosis not present

## 2018-05-06 DIAGNOSIS — F5105 Insomnia due to other mental disorder: Secondary | ICD-10-CM | POA: Diagnosis not present

## 2018-05-06 MED FILL — ESCITALOPRAM 10 MG TABLET: 10 | 30 days supply | Qty: 30 | Fill #0

## 2018-05-06 MED FILL — traZODone HCL 50 MG TABS: 50 | 30 days supply | Qty: 60 | Fill #0

## 2018-05-07 DIAGNOSIS — F1210 Cannabis abuse, uncomplicated: Secondary | ICD-10-CM | POA: Diagnosis not present

## 2018-05-07 DIAGNOSIS — F330 Major depressive disorder, recurrent, mild: Secondary | ICD-10-CM | POA: Diagnosis not present

## 2018-05-07 DIAGNOSIS — F411 Generalized anxiety disorder: Secondary | ICD-10-CM | POA: Diagnosis not present

## 2018-05-11 DIAGNOSIS — E890 Postprocedural hypothyroidism: Secondary | ICD-10-CM | POA: Diagnosis not present

## 2018-05-11 DIAGNOSIS — C73 Malignant neoplasm of thyroid gland: Secondary | ICD-10-CM | POA: Diagnosis not present

## 2018-05-11 DIAGNOSIS — F419 Anxiety disorder, unspecified: Secondary | ICD-10-CM | POA: Diagnosis not present

## 2018-05-11 DIAGNOSIS — L659 Nonscarring hair loss, unspecified: Secondary | ICD-10-CM | POA: Diagnosis not present

## 2018-05-17 ENCOUNTER — Telehealth: Payer: 59 | Admitting: Physician Assistant

## 2018-05-17 ENCOUNTER — Encounter: Payer: Self-pay | Admitting: Physician Assistant

## 2018-05-17 DIAGNOSIS — J069 Acute upper respiratory infection, unspecified: Secondary | ICD-10-CM | POA: Diagnosis not present

## 2018-05-17 MED ORDER — FLUTICASONE PROPIONATE 50 MCG/ACT NA SUSP: 2.0000 | Freq: Every day | NASAL | 0 refills | Status: AC

## 2018-05-17 MED ORDER — BENZONATATE 100 MG PO CAPS: 100.0000 mg | ORAL_CAPSULE | Freq: Three times a day (TID) | ORAL | 0 refills | Status: DC | PRN

## 2018-05-17 MED ORDER — CETIRIZINE HCL 10 MG PO TABS: 10.0000 mg | ORAL_TABLET | Freq: Every day | ORAL | 0 refills | Status: AC

## 2018-05-17 MED FILL — FLUTICASONE PROP 50 MCG SPR: 50 | 30 days supply | Qty: 16 | Fill #0

## 2018-05-17 MED FILL — BENZONATATE 100 MG CAP: 100 | 3 days supply | Qty: 20 | Fill #0

## 2018-05-17 NOTE — Progress Notes (Signed)
We are sorry that you are not feeling well.  Here is how we plan to help!  Based on your presentation I believe you most likely have A cough due to allergies.  I recommend that you start the an over-the counter-allergy medication such as Claritin 10 mg or Zyrtec 10 mg daily.     In addition you may use A prescription cough medication called Tessalon Perles 100mg . You may take 1-2 capsules every 8 hours as needed for your cough.  I have also prescribed Flonse, a steroidal base nasal spray- 2 sprays in each nostril once daily.   From your responses in the eVisit questionnaire you describe inflammation in the upper respiratory tract which is causing a significant cough.  This is commonly called Bronchitis and has four common causes:    Allergies  Viral Infections  Acid Reflux  Bacterial Infection Allergies, viruses and acid reflux are treated by controlling symptoms or eliminating the cause. An example might be a cough caused by taking certain blood pressure medications. You stop the cough by changing the medication. Another example might be a cough caused by acid reflux. Controlling the reflux helps control the cough.  USE OF BRONCHODILATOR ("RESCUE") INHALERS: There is a risk from using your bronchodilator too frequently.  The risk is that over-reliance on a medication which only relaxes the muscles surrounding the breathing tubes can reduce the effectiveness of medications prescribed to reduce swelling and congestion of the tubes themselves.  Although you feel brief relief from the bronchodilator inhaler, your asthma may actually be worsening with the tubes becoming more swollen and filled with mucus.  This can delay other crucial treatments, such as oral steroid medications. If you need to use a bronchodilator inhaler daily, several times per day, you should discuss this with your provider.  There are probably better treatments that could be used to keep your asthma under control.     HOME  CARE . Only take medications as instructed by your medical team. . Complete the entire course of an antibiotic. . Drink plenty of fluids and get plenty of rest. . Avoid close contacts especially the very young and the elderly . Cover your mouth if you cough or cough into your sleeve. . Always remember to wash your hands . A steam or ultrasonic humidifier can help congestion.   GET HELP RIGHT AWAY IF: . You develop worsening fever. . You become short of breath . You cough up blood. . Your symptoms persist after you have completed your treatment plan MAKE SURE YOU   Understand these instructions.  Will watch your condition.  Will get help right away if you are not doing well or get worse.  Your e-visit answers were reviewed by a board certified advanced clinical practitioner to complete your personal care plan.  Depending on the condition, your plan could have included both over the counter or prescription medications. If there is a problem please reply  once you have received a response from your provider. Your safety is important to [place].  If you have drug allergies check your prescription carefully.    You can use MyChart to ask questions about today's visit, request a non-urgent call back, or ask for a work or school excuse for 24 hours related to this e-Visit. If it has been greater than 24 hours you will need to follow up with your provider, or enter a new e-Visit to address those concerns. You will get an e-mail in the next two days asking  about your experience.  I hope that your e-visit has been valuable and will speed your recovery. Thank you for using e-visits.  I have spent 7 min to complete this note- Lacy Duverney [facility]

## 2018-05-19 MED FILL — SYNTHROID 175 MCG TABLET: 175 | 90 days supply | Qty: 90 | Fill #0

## 2018-05-26 MED FILL — LIOTHYRONINE SODIUM 25 MCG: 25 | 90 days supply | Qty: 90 | Fill #0

## 2018-05-28 DIAGNOSIS — F411 Generalized anxiety disorder: Secondary | ICD-10-CM | POA: Diagnosis not present

## 2018-05-28 DIAGNOSIS — F5105 Insomnia due to other mental disorder: Secondary | ICD-10-CM | POA: Diagnosis not present

## 2018-05-28 DIAGNOSIS — F330 Major depressive disorder, recurrent, mild: Secondary | ICD-10-CM | POA: Diagnosis not present

## 2018-05-28 DIAGNOSIS — F1210 Cannabis abuse, uncomplicated: Secondary | ICD-10-CM | POA: Diagnosis not present

## 2018-05-28 MED FILL — BUPROPION HCL SR 150 MG TAB: 150 | 30 days supply | Qty: 60 | Fill #0

## 2018-06-21 DIAGNOSIS — F330 Major depressive disorder, recurrent, mild: Secondary | ICD-10-CM | POA: Diagnosis not present

## 2018-06-21 DIAGNOSIS — F411 Generalized anxiety disorder: Secondary | ICD-10-CM | POA: Diagnosis not present

## 2018-06-21 DIAGNOSIS — F5105 Insomnia due to other mental disorder: Secondary | ICD-10-CM | POA: Diagnosis not present

## 2018-06-21 DIAGNOSIS — F1210 Cannabis abuse, uncomplicated: Secondary | ICD-10-CM | POA: Diagnosis not present

## 2018-06-21 MED FILL — SERTRALINE HCL 50 MG TABLET: 50 | 30 days supply | Qty: 30 | Fill #0

## 2018-06-21 MED FILL — buPROPion HCL ER (XL) 300 M: 300 | 30 days supply | Qty: 30 | Fill #0

## 2018-07-08 DIAGNOSIS — F330 Major depressive disorder, recurrent, mild: Secondary | ICD-10-CM | POA: Diagnosis not present

## 2018-07-08 DIAGNOSIS — F5105 Insomnia due to other mental disorder: Secondary | ICD-10-CM | POA: Diagnosis not present

## 2018-07-08 DIAGNOSIS — F411 Generalized anxiety disorder: Secondary | ICD-10-CM | POA: Diagnosis not present

## 2018-07-08 DIAGNOSIS — F1210 Cannabis abuse, uncomplicated: Secondary | ICD-10-CM | POA: Diagnosis not present

## 2018-07-08 MED FILL — traZODone HCL 50 MG TABS: 50 | 90 days supply | Qty: 180 | Fill #0

## 2018-07-08 MED FILL — SERTRALINE HCL 50 MG TABLET: 50 | 90 days supply | Qty: 90 | Fill #0

## 2018-07-08 MED FILL — buPROPion HCL ER (XL) 300 M: 300 | 90 days supply | Qty: 90 | Fill #0

## 2018-07-14 DIAGNOSIS — Z124 Encounter for screening for malignant neoplasm of cervix: Secondary | ICD-10-CM | POA: Diagnosis not present

## 2018-07-14 DIAGNOSIS — Z01419 Encounter for gynecological examination (general) (routine) without abnormal findings: Secondary | ICD-10-CM | POA: Diagnosis not present

## 2018-07-14 DIAGNOSIS — Z113 Encounter for screening for infections with a predominantly sexual mode of transmission: Secondary | ICD-10-CM | POA: Diagnosis not present

## 2018-07-14 DIAGNOSIS — F17200 Nicotine dependence, unspecified, uncomplicated: Secondary | ICD-10-CM | POA: Insufficient documentation

## 2018-07-19 MED FILL — NEXPLANON 68 MG IMPLANT: 68 | 90 days supply | Qty: 1 | Fill #0

## 2018-07-30 MED FILL — SYNTHROID 175 MCG TABLET: 175 | 90 days supply | Qty: 90 | Fill #1

## 2018-08-05 DIAGNOSIS — Z30432 Encounter for removal of intrauterine contraceptive device: Secondary | ICD-10-CM | POA: Diagnosis not present

## 2018-08-05 DIAGNOSIS — Z3049 Encounter for surveillance of other contraceptives: Secondary | ICD-10-CM | POA: Diagnosis not present

## 2018-08-06 DIAGNOSIS — F1210 Cannabis abuse, uncomplicated: Secondary | ICD-10-CM | POA: Diagnosis not present

## 2018-08-06 DIAGNOSIS — F5105 Insomnia due to other mental disorder: Secondary | ICD-10-CM | POA: Diagnosis not present

## 2018-08-06 DIAGNOSIS — F330 Major depressive disorder, recurrent, mild: Secondary | ICD-10-CM | POA: Diagnosis not present

## 2018-08-06 DIAGNOSIS — F411 Generalized anxiety disorder: Secondary | ICD-10-CM | POA: Diagnosis not present

## 2018-08-06 MED FILL — LIOTHYRONINE SODIUM 25 MCG: 25 | 90 days supply | Qty: 90 | Fill #1

## 2018-08-11 DIAGNOSIS — C73 Malignant neoplasm of thyroid gland: Secondary | ICD-10-CM | POA: Diagnosis not present

## 2018-08-11 DIAGNOSIS — E890 Postprocedural hypothyroidism: Secondary | ICD-10-CM | POA: Diagnosis not present

## 2018-09-10 ENCOUNTER — Telehealth: Payer: 59 | Admitting: Family

## 2018-09-10 DIAGNOSIS — H9202 Otalgia, left ear: Secondary | ICD-10-CM | POA: Diagnosis not present

## 2018-09-10 DIAGNOSIS — J029 Acute pharyngitis, unspecified: Secondary | ICD-10-CM | POA: Diagnosis not present

## 2018-09-10 DIAGNOSIS — Z719 Counseling, unspecified: Secondary | ICD-10-CM

## 2018-09-10 NOTE — Progress Notes (Signed)
Called pt for info. Pt reported "I have a virtual visit with my doctor and want to keep that one instead."  Don't charge the pt for this visit.  Guadelupe Sabin, DNP, FNP-BC [place] E-Visit Team

## 2018-10-01 DIAGNOSIS — F411 Generalized anxiety disorder: Secondary | ICD-10-CM | POA: Diagnosis not present

## 2018-10-01 DIAGNOSIS — F330 Major depressive disorder, recurrent, mild: Secondary | ICD-10-CM | POA: Diagnosis not present

## 2018-10-01 DIAGNOSIS — F1211 Cannabis abuse, in remission: Secondary | ICD-10-CM | POA: Diagnosis not present

## 2018-10-01 DIAGNOSIS — F5105 Insomnia due to other mental disorder: Secondary | ICD-10-CM | POA: Diagnosis not present

## 2018-10-01 MED FILL — buPROPion HCL ER (XL) 300 M: 300 | 90 days supply | Qty: 90 | Fill #0

## 2018-10-01 MED FILL — SERTRALINE HCL 50 MG TABLET: 50 | 90 days supply | Qty: 90 | Fill #0

## 2018-10-04 DIAGNOSIS — G4733 Obstructive sleep apnea (adult) (pediatric): Secondary | ICD-10-CM | POA: Diagnosis not present

## 2018-10-04 DIAGNOSIS — G47419 Narcolepsy without cataplexy: Secondary | ICD-10-CM | POA: Diagnosis not present

## 2018-10-04 DIAGNOSIS — F410 Panic disorder [episodic paroxysmal anxiety] without agoraphobia: Secondary | ICD-10-CM | POA: Diagnosis not present

## 2018-10-04 DIAGNOSIS — Z0000 Encounter for general adult medical examination without abnormal findings: Secondary | ICD-10-CM | POA: Diagnosis not present

## 2018-10-04 DIAGNOSIS — Z13220 Encounter for screening for lipoid disorders: Secondary | ICD-10-CM | POA: Diagnosis not present

## 2018-10-04 DIAGNOSIS — E890 Postprocedural hypothyroidism: Secondary | ICD-10-CM | POA: Diagnosis not present

## 2018-11-15 MED FILL — SYNTHROID 175 MCG TABLET: 175 | 90 days supply | Qty: 90 | Fill #2

## 2019-01-18 DIAGNOSIS — F4329 Adjustment disorder with other symptoms: Secondary | ICD-10-CM | POA: Diagnosis not present

## 2019-01-18 DIAGNOSIS — C73 Malignant neoplasm of thyroid gland: Secondary | ICD-10-CM | POA: Diagnosis not present

## 2019-01-18 DIAGNOSIS — F4321 Adjustment disorder with depressed mood: Secondary | ICD-10-CM | POA: Diagnosis not present

## 2019-01-18 DIAGNOSIS — E890 Postprocedural hypothyroidism: Secondary | ICD-10-CM | POA: Diagnosis not present

## 2019-01-18 DIAGNOSIS — Z85850 Personal history of malignant neoplasm of thyroid: Secondary | ICD-10-CM | POA: Diagnosis not present

## 2019-01-24 DIAGNOSIS — C73 Malignant neoplasm of thyroid gland: Secondary | ICD-10-CM | POA: Diagnosis not present

## 2019-02-16 MED FILL — SYNTHROID 175 MCG TABLET: 175 | 90 days supply | Qty: 90 | Fill #3

## 2019-03-14 MED FILL — LIOTHYRONINE SODIUM 25 MCG: 25 | 90 days supply | Qty: 45 | Fill #0

## 2019-03-22 DIAGNOSIS — H5213 Myopia, bilateral: Secondary | ICD-10-CM | POA: Diagnosis not present

## 2019-05-27 MED FILL — SYNTHROID 175 MCG TABLET: 175 | 30 days supply | Qty: 30 | Fill #0

## 2019-06-17 MED FILL — LIOTHYRONINE SODIUM 25 MCG: 25 | 30 days supply | Qty: 15 | Fill #1

## 2019-06-21 MED FILL — SYNTHROID 175 MCG TABLET: 175 | 30 days supply | Qty: 30 | Fill #1

## 2019-12-28 ENCOUNTER — Other Ambulatory Visit (HOSPITAL_COMMUNITY): Payer: Self-pay | Admitting: Family Medicine

## 2019-12-28 MED FILL — METHYLPHENIDATE 10 MG TAB: 10 | 30 days supply | Qty: 30 | Fill #0

## 2019-12-28 MED FILL — APO-VARENICLINE 0.5 MG TABS: 0.5 | 14 days supply | Qty: 56 | Fill #0

## 2020-04-02 ENCOUNTER — Other Ambulatory Visit (HOSPITAL_COMMUNITY): Payer: Self-pay | Admitting: Family Medicine

## 2020-04-02 MED FILL — METHYLPHENIDATE HCL 10 MG T: 10 | 30 days supply | Qty: 30 | Fill #0

## 2020-04-02 MED FILL — ALPRAZolam 0.5 MG TABS: 0.5 | 15 days supply | Qty: 15 | Fill #0

## 2020-04-16 ENCOUNTER — Other Ambulatory Visit: Payer: Self-pay

## 2020-04-16 ENCOUNTER — Ambulatory Visit: Payer: 59 | Admitting: Family Medicine

## 2020-04-16 VITALS — BP 140/82 | Ht 68.0 in | Wt 218.0 lb

## 2020-04-16 DIAGNOSIS — S62339A Displaced fracture of neck of unspecified metacarpal bone, initial encounter for closed fracture: Secondary | ICD-10-CM

## 2020-04-16 NOTE — Assessment & Plan Note (Signed)
Injury occurred on 2/13. Placed in ulnar gutter.  -Counseled on supportive care. -Continue ulnar gutter splint. -Follow-up in 1 week.

## 2020-04-16 NOTE — Patient Instructions (Signed)
Nice to meet you Please continue the brace  Please send me a message in MyChart with any questions or updates.  Please see me back in 1 week.   --Dr. Raeford Razor

## 2020-04-16 NOTE — Progress Notes (Signed)
Sophia West - 30 y.o. female MRN [id number]  Date of birth: Apr 22, 1990  SUBJECTIVE:  Including CC & ROS.  No chief complaint on file.   Sophia West is a 30 y.o. female that is presenting with right hand pain.  She punched a door frame.  She had swelling and ecchymosis over the ulnar aspect of the fifth digit.  Limited function of the pinky finger.  Review of the right hand x-ray x-ray from 2/13 shows aDistal fifth metacarpal fracture.   Review of Systems See HPI   HISTORY: Past Medical, Surgical, Social, and Family History Reviewed & Updated per EMR.   Pertinent Historical Findings include:  Past Medical History:  Diagnosis Date  . Anxiety   . Asthma   . Bipolar 1 disorder ([place])   . Depression   . 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 Hypersomnia   . Narcolepsy   . OSA (obstructive sleep apnea)   . Seasonal allergies   . Thyroid ca [facility])     Past Surgical History:  Procedure Laterality Date  . BREAST SURGERY  12/2009   lump removed left breast  . RHINOPLASTY  2009  . THYROID SURGERY    . THYROIDECTOMY  12/20/2012  . TONSILECTOMY, ADENOIDECTOMY, BILATERAL MYRINGOTOMY AND TUBES  2009    Family History  Problem Relation Age of Onset  . Thyroid disease Mother   . Diabetes Mother   . Bipolar disorder Mother   . Bipolar disorder Father   . Cancer Other        GREAT AUNT, breast  . Asthma Brother   . Bipolar disorder Brother     Social History   Socioeconomic History  . Marital status: Single    Spouse name: Not on file  . Number of children: Not on file  . Years of education: Not on file  . Highest education level: Not on file  Occupational History  . Occupation: Chartered certified accountant  . Occupation: Ship broker  Tobacco Use  . Smoking status: Former Smoker    Packs/day: 1.00    Years: 8.00    Pack years: 8.00    Types: Cigarettes    Quit date: 04/03/2012    Years since quitting: 8.0  . Smokeless tobacco: Never Used  Substance and Sexual Activity  . Alcohol  use: Yes    Alcohol/week: 1.0 standard drink    Types: 1 Glasses of wine per week    Comment: occ  . Drug use: No  . Sexual activity: Yes    Birth control/protection: Implant  Other Topics Concern  . Not on file  Social History Narrative  . Not on file   Social Determinants of Health   Financial Resource Strain: Not on file  Food Insecurity: Not on file  Transportation Needs: Not on file  Physical Activity: Not on file  Stress: Not on file  Social Connections: Not on file  Intimate Partner Violence: Not on file     PHYSICAL EXAM:  VS: BP 140/82 (BP Location: Right Arm, Patient Position: Sitting, Cuff Size: Large)   Ht 5\' 8"  (1.727 m)   Wt 218 lb (98.9 kg)   BMI 33.15 kg/m  Physical Exam Gen: NAD, alert, cooperative with exam, well-appearing MSK:  Right hand: Swelling and ecchymosis over the fifth digit. No malrotation or misalignment. Tenderness palpation of the fifth MCP joint. Neurovascularly intact     ASSESSMENT & PLAN:   Boxer's metacarpal fracture, neck, closed Injury occurred on 2/13. Placed in ulnar  gutter.  -Counseled on supportive care. -Continue ulnar gutter splint. -Follow-up in 1 week.

## 2020-04-25 ENCOUNTER — Ambulatory Visit (INDEPENDENT_AMBULATORY_CARE_PROVIDER_SITE_OTHER): Payer: Self-pay | Admitting: Family Medicine

## 2020-04-25 ENCOUNTER — Ambulatory Visit (HOSPITAL_BASED_OUTPATIENT_CLINIC_OR_DEPARTMENT_OTHER)
Admission: RE | Admit: 2020-04-25 | Discharge: 2020-04-25 | Disposition: A | Discharge status: Home / Self Care | Payer: Self-pay | Source: Ambulatory Visit | Attending: Family Medicine | Admitting: Family Medicine

## 2020-04-25 ENCOUNTER — Other Ambulatory Visit: Payer: Self-pay

## 2020-04-25 DIAGNOSIS — S62339D Displaced fracture of neck of unspecified metacarpal bone, subsequent encounter for fracture with routine healing: Secondary | ICD-10-CM

## 2020-04-25 NOTE — Patient Instructions (Signed)
Good to see you Please use ice as needed  Please stop the splint on March 5.  Please try the exercises after taking off the splint    Please send me a message in MyChart with any questions or updates.  Please see me back in 2 weeks.   --Dr. Raeford Razor

## 2020-04-25 NOTE — Progress Notes (Signed)
Sophia West - 30 y.o. female MRN [id number]  Date of birth: 04/04/1990  SUBJECTIVE:  Including CC & ROS.  No chief complaint on file.   Sophia West is a 30 y.o. female that is following up for fracture of her right hand.  Has been using the splint.  Denies any numbness or tingling.  Swelling has improved.   Review of Systems See HPI   HISTORY: Past Medical, Surgical, Social, and Family History Reviewed & Updated per EMR.   Pertinent Historical Findings include:  Past Medical History:  Diagnosis Date  . Anxiety   . Asthma   . Bipolar 1 disorder ([place])   . Depression   . 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 Hypersomnia   . Narcolepsy   . OSA (obstructive sleep apnea)   . Seasonal allergies   . Thyroid ca [facility])     Past Surgical History:  Procedure Laterality Date  . BREAST SURGERY  12/2009   lump removed left breast  . RHINOPLASTY  2009  . THYROID SURGERY    . THYROIDECTOMY  12/20/2012  . TONSILECTOMY, ADENOIDECTOMY, BILATERAL MYRINGOTOMY AND TUBES  2009    Family History  Problem Relation Age of Onset  . Thyroid disease Mother   . Diabetes Mother   . Bipolar disorder Mother   . Bipolar disorder Father   . Cancer Other        GREAT AUNT, breast  . Asthma Brother   . Bipolar disorder Brother     Social History   Socioeconomic History  . Marital status: Single    Spouse name: Not on file  . Number of children: Not on file  . Years of education: Not on file  . Highest education level: Not on file  Occupational History  . Occupation: Chartered certified accountant  . Occupation: Ship broker  Tobacco Use  . Smoking status: Former Smoker    Packs/day: 1.00    Years: 8.00    Pack years: 8.00    Types: Cigarettes    Quit date: 04/03/2012    Years since quitting: 8.0  . Smokeless tobacco: Never Used  Substance and Sexual Activity  . Alcohol use: Yes    Alcohol/week: 1.0 standard drink    Types: 1 Glasses of wine per week    Comment: occ  . Drug use: No  . Sexual  activity: Yes    Birth control/protection: Implant  Other Topics Concern  . Not on file  Social History Narrative  . Not on file   Social Determinants of Health   Financial Resource Strain: Not on file  Food Insecurity: Not on file  Transportation Needs: Not on file  Physical Activity: Not on file  Stress: Not on file  Social Connections: Not on file  Intimate Partner Violence: Not on file     PHYSICAL EXAM:  VS: BP 110/62 (BP Location: Right Arm, Patient Position: Sitting, Cuff Size: Normal)   Ht 5\' 8"  (1.727 m)   Wt 218 lb (98.9 kg)   LMP 04/11/2020   BMI 33.15 kg/m  Physical Exam Gen: NAD, alert, cooperative with exam, well-appearing MSK:  Right hand: Swelling and ecchymosis over the fifth metacarpal. No malrotation or misalignment. Tenderness to palpation over the fifth MCP joint. Neurovascular intact  1. Wrist and hand  2. right 3. Ulnar gutter 4. Ortho glass 5. Applied by Dr. Raeford Razor    ASSESSMENT & PLAN:   Boxer's metacarpal fracture, neck, closed Injury occurred on 2/13.  Independent review  of x-ray today shows boxer's fracture of fifth metacarpal.  Swelling is improved. -Counseled on supportive care. -Placed in ulnar gutter splint.  Counseled on weaning out of it. -Follow-up in 2 weeks.

## 2020-04-25 NOTE — Assessment & Plan Note (Addendum)
Injury occurred on 2/13.  Independent review of x-ray today shows boxer's fracture of fifth metacarpal.  Swelling is improved. -Counseled on supportive care. -Placed in ulnar gutter splint.  Counseled on weaning out of it. -Follow-up in 2 weeks.

## 2020-04-27 ENCOUNTER — Telehealth: Payer: Self-pay | Admitting: Family Medicine

## 2020-04-27 NOTE — Telephone Encounter (Signed)
Left VM for patient. If she calls back please have her speak with a nurse/CMA and inform that the xray shows stable position of her fracture. Continue with the plan we discussed .   If any questions then please take the best time and phone number to call and I will try to call her back.   Rosemarie Ax, MD [facility] 04/27/2020, 12:21 PM

## 2020-04-30 NOTE — Telephone Encounter (Signed)
Pt informed of below.  

## 2020-05-15 ENCOUNTER — Ambulatory Visit (HOSPITAL_BASED_OUTPATIENT_CLINIC_OR_DEPARTMENT_OTHER)
Admission: RE | Admit: 2020-05-15 | Discharge: 2020-05-15 | Disposition: A | Discharge status: Home / Self Care | Payer: Self-pay | Source: Ambulatory Visit | Attending: Family Medicine | Admitting: Family Medicine

## 2020-05-15 ENCOUNTER — Other Ambulatory Visit: Payer: Self-pay

## 2020-05-15 ENCOUNTER — Ambulatory Visit (INDEPENDENT_AMBULATORY_CARE_PROVIDER_SITE_OTHER): Payer: Self-pay | Admitting: Family Medicine

## 2020-05-15 DIAGNOSIS — S62339D Displaced fracture of neck of unspecified metacarpal bone, subsequent encounter for fracture with routine healing: Secondary | ICD-10-CM

## 2020-05-15 NOTE — Assessment & Plan Note (Addendum)
Injury occurred on 2/13.  Ongoing improvement.  Independent review of the x-ray from today shows mild displacement. -Counseled on home exercise therapy and supportive care. -Discontinued ulnar gutter splint. -Follow-up in 2 to 3 weeks.

## 2020-05-15 NOTE — Patient Instructions (Signed)
Good to see you Please try range of motion movements.   Please send me a message in MyChart with any questions or updates.  Please see me back in 2-3 weeks.   --Dr. Raeford Razor

## 2020-05-15 NOTE — Progress Notes (Signed)
Sophia West - 30 y.o. female MRN [id number]  Date of birth: 09/06/1990  SUBJECTIVE:  Including CC & ROS.  No chief complaint on file.   Sophia West is a 30 y.o. female that is following up for her right fifth metacarpal fracture.  Has been doing well.  The swelling and bruising have improved.   Review of Systems See HPI   HISTORY: Past Medical, Surgical, Social, and Family History Reviewed & Updated per EMR.   Pertinent Historical Findings include:  Past Medical History:  Diagnosis Date   Anxiety    Asthma    Bipolar 1 disorder ([place])    Depression    Hurthle cell carcinoma of thyroid ([facility])    Hypersomnia    Narcolepsy    OSA (obstructive sleep apnea)    Seasonal allergies    Thyroid ca [facility])     Past Surgical History:  Procedure Laterality Date   BREAST SURGERY  12/2009   lump removed left breast   RHINOPLASTY  2009   THYROID SURGERY     THYROIDECTOMY  12/20/2012   TONSILECTOMY, ADENOIDECTOMY, BILATERAL MYRINGOTOMY AND TUBES  2009    Family History  Problem Relation Age of Onset   Thyroid disease Mother    Diabetes Mother    Bipolar disorder Mother    Bipolar disorder Father    Cancer Other        GREAT AUNT, breast   Asthma Brother    Bipolar disorder Brother     Social History   Socioeconomic History   Marital status: Single    Spouse name: Not on file   Number of children: Not on file   Years of education: Not on file   Highest education level: Not on file  Occupational History   Occupation: Chartered certified accountant   Occupation: Student  Tobacco Use   Smoking status: Former Smoker    Packs/day: 1.00    Years: 8.00    Pack years: 8.00    Types: Cigarettes    Quit date: 04/03/2012    Years since quitting: 8.1   Smokeless tobacco: Never Used  Substance and Sexual Activity   Alcohol use: Yes    Alcohol/week: 1.0 standard drink    Types: 1 Glasses of wine per week    Comment: occ   Drug use: No   Sexual activity: Yes     Birth control/protection: Implant  Other Topics Concern   Not on file  Social History Narrative   Not on file   Social Determinants of Health   Financial Resource Strain: Not on file  Food Insecurity: Not on file  Transportation Needs: Not on file  Physical Activity: Not on file  Stress: Not on file  Social Connections: Not on file  Intimate Partner Violence: Not on file     PHYSICAL EXAM:  VS: BP 120/80 (BP Location: Right Arm, Patient Position: Sitting, Cuff Size: Large)    Ht 5\' 8"  (1.727 m)    Wt 218 lb (98.9 kg)    BMI 33.15 kg/m  Physical Exam Gen: NAD, alert, cooperative with exam, well-appearing MSK:  Right hand: Some tenderness palpation of the fifth MCP joint. No malrotation or misalignment. Neurovascular intact     ASSESSMENT & PLAN:   Boxer's metacarpal fracture, neck, closed Injury occurred on 2/13.  Ongoing improvement.  Independent review of the x-ray from today shows mild displacement. -Counseled on home exercise therapy and supportive care. -Discontinued ulnar gutter splint. -Follow-up in 2 to 3 weeks.

## 2020-05-22 ENCOUNTER — Other Ambulatory Visit (HOSPITAL_BASED_OUTPATIENT_CLINIC_OR_DEPARTMENT_OTHER): Payer: Self-pay

## 2020-06-06 ENCOUNTER — Other Ambulatory Visit: Payer: Self-pay

## 2020-06-06 ENCOUNTER — Telehealth (INDEPENDENT_AMBULATORY_CARE_PROVIDER_SITE_OTHER): Payer: Self-pay | Admitting: Family Medicine

## 2020-06-06 DIAGNOSIS — S62339D Displaced fracture of neck of unspecified metacarpal bone, subsequent encounter for fracture with routine healing: Secondary | ICD-10-CM

## 2020-06-06 NOTE — Progress Notes (Signed)
Virtual Visit via Video Note  I connected with Sophia West on 06/06/20 at  2:10 PM EDT by a video enabled telemedicine application and verified that I am speaking with the correct person using two identifiers.  Location: Patient: home Provider: office   I discussed the limitations of evaluation and management by telemedicine and the availability of in person appointments. The patient expressed understanding and agreed to proceed.  History of Present Illness:  Sophia West is a 30 yo F following up for her right boxer's fracture.  She is doing well and continues to get improvement.  She denies any pain.  Does get swelling and soreness from time to time.   Observations/Objective:  Gen: NAD, alert, cooperative with exam, well-appearing  Assessment and Plan:  Boxer's fracture right hand: Initial injury on 2/13.  Continues to have improvement in her pain and function. -Counseled on home exercise therapy and supportive care. -Could reimage if needed.   Follow Up Instructions:    I discussed the assessment and treatment plan with the patient. The patient was provided an opportunity to ask questions and all were answered. The patient agreed with the plan and demonstrated an understanding of the instructions.   The patient was advised to call back or seek an in-person evaluation if the symptoms worsen or if the condition fails to improve as anticipated.    Clearance Coots, MD

## 2020-06-06 NOTE — Assessment & Plan Note (Signed)
Initial injury on 2/13.  Continues to have improvement in her pain and function. -Counseled on home exercise therapy and supportive care. -Could reimage if needed.

## 2020-07-09 ENCOUNTER — Other Ambulatory Visit (HOSPITAL_COMMUNITY): Payer: Self-pay

## 2020-07-10 ENCOUNTER — Other Ambulatory Visit
Admission: RE | Admit: 2020-07-10 | Discharge: 2020-07-10 | Disposition: A | Discharge status: Home / Self Care | Payer: Self-pay | Source: Ambulatory Visit | Attending: General Surgery | Admitting: General Surgery

## 2020-07-10 ENCOUNTER — Other Ambulatory Visit (HOSPITAL_COMMUNITY): Payer: Self-pay

## 2020-07-10 ENCOUNTER — Other Ambulatory Visit: Payer: Self-pay

## 2020-07-10 ENCOUNTER — Ambulatory Visit: Payer: Self-pay | Admitting: General Surgery

## 2020-07-10 ENCOUNTER — Encounter: Payer: Self-pay | Admitting: General Surgery

## 2020-07-10 VITALS — BP 143/98 | HR 125 | Temp 98.5°F | Ht 68.0 in | Wt 220.4 lb

## 2020-07-10 DIAGNOSIS — Z85850 Personal history of malignant neoplasm of thyroid: Secondary | ICD-10-CM | POA: Insufficient documentation

## 2020-07-10 LAB — TSH: TSH: 0.407 u[IU]/mL (ref 0.350–4.500)

## 2020-07-10 LAB — T4, FREE: Free T4: 0.76 ng/dL (ref 0.61–1.12)

## 2020-07-10 NOTE — Patient Instructions (Signed)
Please go directly to the lab today. Dr.Cannon will call you with the results.

## 2020-07-11 ENCOUNTER — Encounter: Payer: Self-pay | Admitting: General Surgery

## 2020-07-11 LAB — T3, FREE: T3, Free: 2.8 pg/mL (ref 2.0–4.4)

## 2020-07-12 ENCOUNTER — Other Ambulatory Visit: Payer: Self-pay | Admitting: General Surgery

## 2020-07-12 MED ORDER — LIOTHYRONINE SODIUM 5 MCG PO TABS: 5.0000 ug | ORAL_TABLET | Freq: Every day | ORAL | 3 refills | Status: AC

## 2020-07-12 NOTE — Progress Notes (Signed)
Patient ID: Sophia West, female   DOB: Apr 27, 1990, 30 y.o.   MRN: [id number]  Chief Complaint  Patient presents with  . Follow-up    History of thyroid cancer-thyroidectomy 2014    HPI Sophia West is a 30 y.o. female.   She is a patient that I previously followed at [facility].  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 Surgical resection margins are negative (tumor present at < 0.05 cm from inked surface). Uninvolved thyroid gland with lymphocytic thyroiditis. Three benign lymph nodes (0/3).   She underwent RAIA with 150 mCi of I-131.  I last saw her in 2018, at which time she had no evidence of disease.  Her thyroid levels have always been somewhat challenging to regulate in terms of values versus subjective symptoms, but she does have some other medical considerations that may be contributing factors.  Upon my departure from that institution, her care was transitioned to endocrinology.  She followed with Dr. Jerene Canny until she left the institution.  She has subsequently felt some dissatisfaction with her management and sought me out to see if I could be helpful to her.  She is currently prescribed 175 mcg levothyroxine and 12.5 mcg liothyronine.  She endorses malaise and fatigue, weight gain, palpitations, leg cramps, headaches, both diarrhea and constipation, depression, and anxiety.  She describes cold intolerance.  She does endorse poor energy.  Her appetite is diminished.  She says that she feels drained every day and has very little energy.  Of note, she has been working as a travel Marine scientist during the pandemic and has had a number of very challenging assignments, particularly in ICU care.  This has been quite  stressful on her.    Past Medical History:  Diagnosis Date  . Anxiety   . Asthma   . Bipolar 1 disorder ([place])   . Depression   . Hurthle cell carcinoma of thyroid ([place])   . Hypersomnia   . Narcolepsy   . OSA (obstructive sleep apnea)   . Seasonal allergies   . Thyroid ca [facility])     Past Surgical History:  Procedure Laterality Date  . BREAST SURGERY  12/2009   lump removed left breast  . RHINOPLASTY  2009  . THYROID SURGERY    . THYROIDECTOMY  12/20/2012  . TONSILECTOMY, ADENOIDECTOMY, BILATERAL MYRINGOTOMY AND TUBES  2009    Family History  Problem Relation Age of Onset  . Thyroid disease Mother   . Diabetes Mother   . Bipolar disorder Mother   . Bipolar disorder Father   . Cancer Other        GREAT AUNT, breast  . Asthma Brother   . Bipolar disorder Brother     Social History Social History   Tobacco Use  . Smoking status: Former Smoker    Packs/day: 1.00    Years: 8.00    Pack years: 8.00    Types: Cigarettes    Quit date: 04/03/2012    Years since quitting: 8.2  . Smokeless tobacco: Never Used  Substance Use Topics  . Alcohol use: Yes    Alcohol/week: 1.0 standard drink    Types: 1 Glasses of wine per week    Comment: occ  . Drug use: No    No Known Allergies  Current Outpatient Medications  Medication  Sig Dispense Refill  . albuterol (PROVENTIL HFA;VENTOLIN HFA) 108 (90 BASE) MCG/ACT inhaler Inhale 2 puffs into the lungs every 6 (six) hours as needed for wheezing.    Marland Kitchen ALPRAZolam (XANAX) 0.5 MG tablet TAKE 1 TABLET BY MOUTH ONCE A DAY AS NEEDED 15 tablet 0  . cetirizine (ZYRTEC) 10 MG tablet Take 1 tablet (10 mg total) by mouth daily. 30 tablet 0  . etonogestrel (NEXPLANON) 68 MG IMPL implant 1 each by Subdermal route once.    . fluticasone (FLONASE) 50 MCG/ACT nasal spray Place 2 sprays into both nostrils daily. 16 g 0  . liothyronine (CYTOMEL) 25 MCG tablet Take 25 mcg by mouth daily. Taking 1/2 tablet daily    . methylphenidate (RITALIN) 10 MG  tablet Take 1 tablet (10 mg total) by mouth as needed (excessive daytime sleepiness). 30 tablet 0  . SYNTHROID 175 MCG tablet Take 1 tablet (175 mcg total) by mouth daily before breakfast. 30 tablet 2  . methylphenidate (RITALIN) 10 MG tablet TAKE 1 TABLET BY MOUTH ONCE A DAY ON A EMPTY STOMACH AS NEEDED 30 tablet 0   No current facility-administered medications for this visit.    Review of Systems Review of Systems  All other systems reviewed and are negative. Or as discussed in the history of present illness.  Blood pressure (!) 143/98, pulse (!) 125, temperature 98.5 F (36.9 C), temperature source Oral, height 5\' 8"  (1.727 m), weight 220 lb 6.4 oz (100 kg), SpO2 98 %. Body mass index is 33.51 kg/m.  Physical Exam Physical Exam Constitutional:      General: She is not in acute distress.    Appearance: She is obese.  HENT:     Head: Normocephalic and atraumatic.     Nose:     Comments: Covered with a mask    Mouth/Throat:     Comments: Covered with a mask Eyes:     General: No scleral icterus.       Right eye: No discharge.        Left eye: No discharge.     Comments: No proptosis or exophthalmos.  Neck:     Comments: The trachea is midline.  There is no palpable cervical or supraclavicular lymphadenopathy.  The thyroid is surgically absent.  No masses are appreciated in the thyroidectomy bed.  Her transverse thyroidectomy scar is well-healed and faded. Cardiovascular:     Rate and Rhythm: Regular rhythm. Tachycardia present.     Pulses: Normal pulses.  Pulmonary:     Effort: Pulmonary effort is normal.     Breath sounds: Normal breath sounds.  Abdominal:     General: Bowel sounds are normal.     Palpations: Abdomen is soft.  Genitourinary:    Comments: Deferred Musculoskeletal:     Comments: Trace ankle edema bilaterally  Skin:    General: Skin is warm and dry.  Neurological:     General: No focal deficit present.     Mental Status: She is alert and oriented to  person, place, and time.  Psychiatric:        Mood and Affect: Mood normal.        Behavior: Behavior normal.     Data Reviewed I reviewed clinic notes from my previous time caring for her, the summary of which is included in my history of present illness.  I also reviewed the subsequent notes from endocrine surgery and endocrinology.  It appears that Ms. Munier will, from time to time, self adjust her  medications.  I copied a portion of the initial consultation from endocrinology here:  "Ms. Dahlia Bailiff, 30 y.o. year old female, coming in at the request of Scarlette Slice Louretta Shorten, MD for evaluation of Thyroid Problem Old records reviewed and summarized below.   5-6 years ago, she started experiencing depression and anxiety getting out of control. Later that year, she was found to have a thyroid condition. Review of outside records show that she had heterogeneous 6.6 cm mass that was biopsied and found concerning for FLUS. She underwent total thyroidectomy in October 2014 (Dr. Fredirick Maudlin). Surgical path showed minimally invasive follicular carcinoma Hurthle cell variant, measuring 6.2 cm. Clear margins but close. 3 lymph nodes negative for malignancy. She underwent radioiodine ablation in December 2014 with 155.5 mCi of I-131. Thyroglobulin tumor marker has been undetectable ever since. Surveillance neck ultrasound in 2015, 2017 and 2019 have been negative.  She was under care from Dr. Fredirick Maudlin postop. For 2 years, her labs were excellent and she felt the best (2017-18). She started to lose weight (70 lbs x 1.5 year) and then had recurrent psychosomatic symptoms. She cut out sodas and started working out 3-5 days weekly. At heaviest, weighed 260 lbs. Reports it has been a "daily struggle".  On cytomel ~6 months after surgery. Dose titrated up to 50 and later down to 25 mcg. Over 2018-19, was on 12.5 mcg daily. She had stopped Cytomel for a month or two. Didn't feel better and went back on 25  mcg again. In terms of LT4, dose was increased from 150 to 200 mcg daily in 01/2018. Since Nov 2019, she has been feeling better and attributes this to the addition back of Cytomel.   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 Reports this has been stressful. She has had panic attacks and has had to use Xanax as well. She has been seeing her therapist regularly. Switched psychiatrists. Started on lexapro just yesterday.   Hair is falling out. She is on Skylar placed 3 years ago. Will switch to Nexplanon in a month. Eats a lot of chocolate. Rare coffee intake. No family h/o thyroid disease."   Labs from [facility] were also reviewed:  Her most recent TSH was 0.041 on January 24, 2020.  Free T4 was 1.1, total T3 was 1.6, thyroglobulin was undetectable, and she had no detectable thyroglobulin antibodies.  She had an ultrasound performed on January 16, 2020 is not able to review the actual images, but I have copied the report here:  [place] HEAD NECK SOFT TISSUE THYROID, 01/16/2020 9:22 AM   INDICATION:thyroid cancer surveillance \ E83.1 Follicular neoplasm of thyroid  ADDITIONAL HISTORY:None.  COMPARISON: Multiple prior imaging studies, most recent thyroid ultrasound 01/24/2019   TECHNIQUE: Multi-planar real-time ultrasonography of the thyroid and adjacent neck using grayscale imaging, supplemented by color Doppler as needed.   FINDINGS:   . Thyroid: Status post total thyroidectomy. No new distinct masses within the operative bed.  . Vascular flow: Normal.  . Soft tissues of neck: Bilateral cervical chain lymph nodes without evidence of abnormal morphology.   CONCLUSION:    Status post total thyroidectomy. No convincing sonographic evidence of recurrent disease.  Assessment This is a 30 year old woman with a past history significant for Hurthle cell carcinoma, status post total thyroidectomy and radioactive iodine ablation.  She has had no  evidence of disease since her initial procedure and treatment, but her thyroid levels have been somewhat challenging to  regulate.  She will occasionally alter her dose independently, and she has other psychosocial concerns that may be contributing to her depression of hyperthyroid symptoms.  Plan We will recheck labs today.  Once I have these results, I will notify the patient and let her know if any adjustments might be appropriate.  I do think she needs to communicate with her therapist regarding some of the stressors that have been present in her life and determine whether medication adjustments may be indicated in that regard as well.  Barring any significant changes, I will plan to see her in 1 years time with a thyroid ultrasound to be performed in office.  Greater than 50% of this 45-minute visit was involved with coordination of patient care and counseling.    Fredirick Maudlin 07/12/2020, 10:04 AM

## 2020-07-31 ENCOUNTER — Other Ambulatory Visit: Payer: Self-pay | Admitting: General Surgery

## 2020-07-31 ENCOUNTER — Other Ambulatory Visit (HOSPITAL_COMMUNITY): Payer: Self-pay

## 2020-07-31 ENCOUNTER — Encounter: Payer: Self-pay | Admitting: General Surgery

## 2020-07-31 MED ORDER — SYNTHROID 175 MCG PO TABS
175.0000 ug | ORAL_TABLET | Freq: Every day | ORAL | 11 refills | Status: AC
  Filled 2020-07-31 – 2020-08-10 (×2): qty 30, 30d supply, fill #0

## 2020-08-01 ENCOUNTER — Encounter: Payer: Self-pay | Admitting: Family Medicine

## 2020-08-02 ENCOUNTER — Encounter: Payer: Self-pay | Admitting: Family Medicine

## 2020-08-02 ENCOUNTER — Other Ambulatory Visit (HOSPITAL_COMMUNITY): Payer: Self-pay

## 2020-08-07 ENCOUNTER — Other Ambulatory Visit (HOSPITAL_COMMUNITY): Payer: Self-pay

## 2020-08-08 ENCOUNTER — Other Ambulatory Visit (HOSPITAL_COMMUNITY): Payer: Self-pay

## 2020-08-10 ENCOUNTER — Other Ambulatory Visit (HOSPITAL_COMMUNITY): Payer: Self-pay

## 2020-08-27 ENCOUNTER — Encounter: Payer: Self-pay | Admitting: General Surgery

## 2020-08-27 ENCOUNTER — Other Ambulatory Visit: Payer: Self-pay

## 2020-08-27 DIAGNOSIS — Z85850 Personal history of malignant neoplasm of thyroid: Secondary | ICD-10-CM

## 2020-08-30 ENCOUNTER — Other Ambulatory Visit
Admission: RE | Admit: 2020-08-30 | Discharge: 2020-08-30 | Disposition: A | Discharge status: Home / Self Care | Payer: No Typology Code available for payment source | Attending: General Surgery | Admitting: General Surgery

## 2020-08-30 DIAGNOSIS — Z85850 Personal history of malignant neoplasm of thyroid: Secondary | ICD-10-CM

## 2020-08-30 LAB — T4, FREE: Free T4: 1.5 ng/dL — ABNORMAL HIGH (ref 0.61–1.12)

## 2020-08-30 LAB — TSH: TSH: 0.043 u[IU]/mL — ABNORMAL LOW (ref 0.350–4.500)

## 2020-08-31 LAB — T3, FREE: T3, Free: 4.3 pg/mL (ref 2.0–4.4)

## 2020-11-21 ENCOUNTER — Encounter: Payer: Self-pay | Admitting: General Surgery

## 2021-02-04 ENCOUNTER — Other Ambulatory Visit: Payer: Self-pay

## 2021-02-04 DIAGNOSIS — Z85850 Personal history of malignant neoplasm of thyroid: Secondary | ICD-10-CM

## 2021-02-21 ENCOUNTER — Telehealth: Payer: Self-pay | Admitting: General Surgery

## 2021-02-21 NOTE — Telephone Encounter (Signed)
Patient is calling and has found another endocrinologist doctor and is needing a referral sent to them.[facility] and there fax number is [phone], phone number [phone].

## 2021-02-21 NOTE — Telephone Encounter (Signed)
Patient called and wanted to be referred to [facility]-fax number [phone] office - [phone]. Referral and all notes faxed and confirmation received. Patient advised to call their office after the Christmas holiday.

## 2021-03-05 ENCOUNTER — Telehealth: Payer: Self-pay

## 2021-03-05 NOTE — Telephone Encounter (Signed)
Spoke with Sophia West at [facility]-referral received and message has been sent to referral coordinator-will call our office when appointment has been scheduled.   Also left patient a message letting her know she can call and schedule appointment with Endocrinology and to call our office with the appointment information.

## 2021-03-21 ENCOUNTER — Other Ambulatory Visit: Payer: Self-pay

## 2021-03-21 ENCOUNTER — Telehealth: Payer: Self-pay | Admitting: General Surgery

## 2021-03-21 DIAGNOSIS — Z85850 Personal history of malignant neoplasm of thyroid: Secondary | ICD-10-CM

## 2021-03-21 NOTE — Telephone Encounter (Signed)
Spoke with Sophia West regarding referral -they have reached out to the patient-unable to leave message due to mail box being full.

## 2021-03-21 NOTE — Telephone Encounter (Signed)
Patient is calling and is needing [place] to send a referral to [facility] there  fax # [phone] patient was seeing Dr. Celine Ahr. Please call patient and advise.

## 2021-07-29 IMAGING — CR DG HAND COMPLETE 3+V*R*
3 series · 3 of 3 positions shown · non-contrast
Comparison: 04/25/2020.

CLINICAL DATA: Closed fracture, stiffness, subsequent encounter.

EXAM:
RIGHT HAND - COMPLETE 3+ VIEW

[x hand pa right]
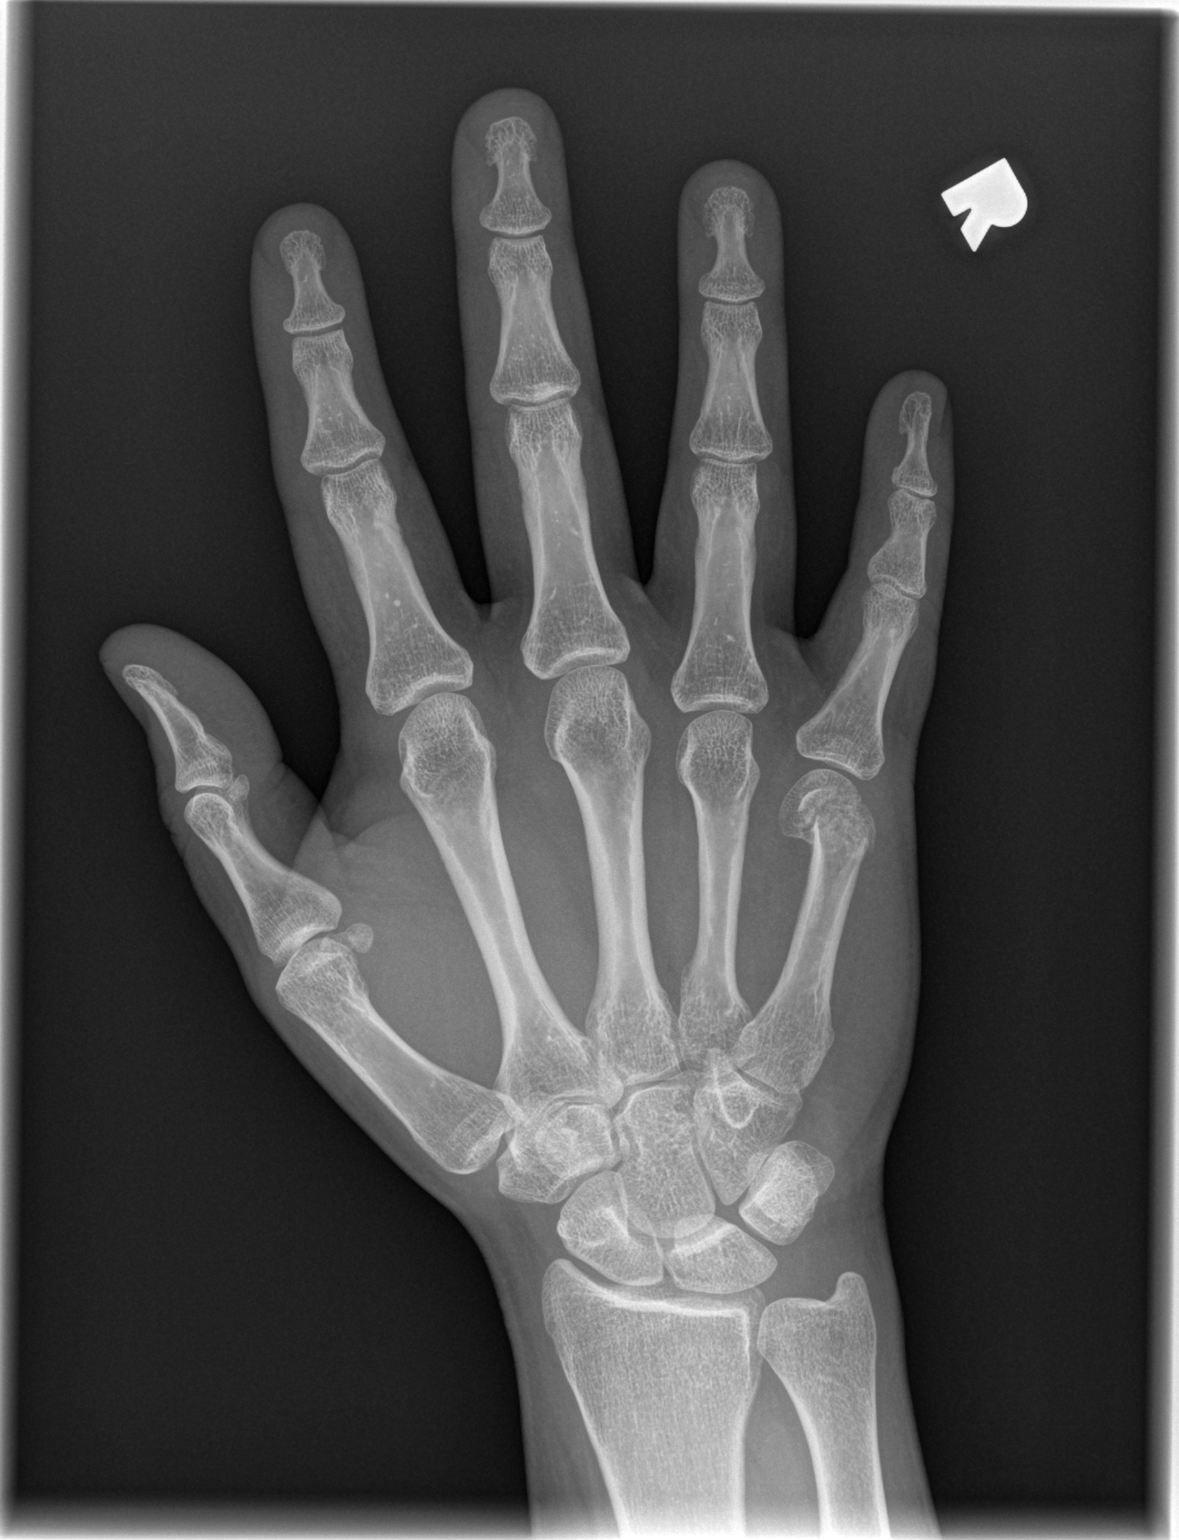

[x hand oblique right]
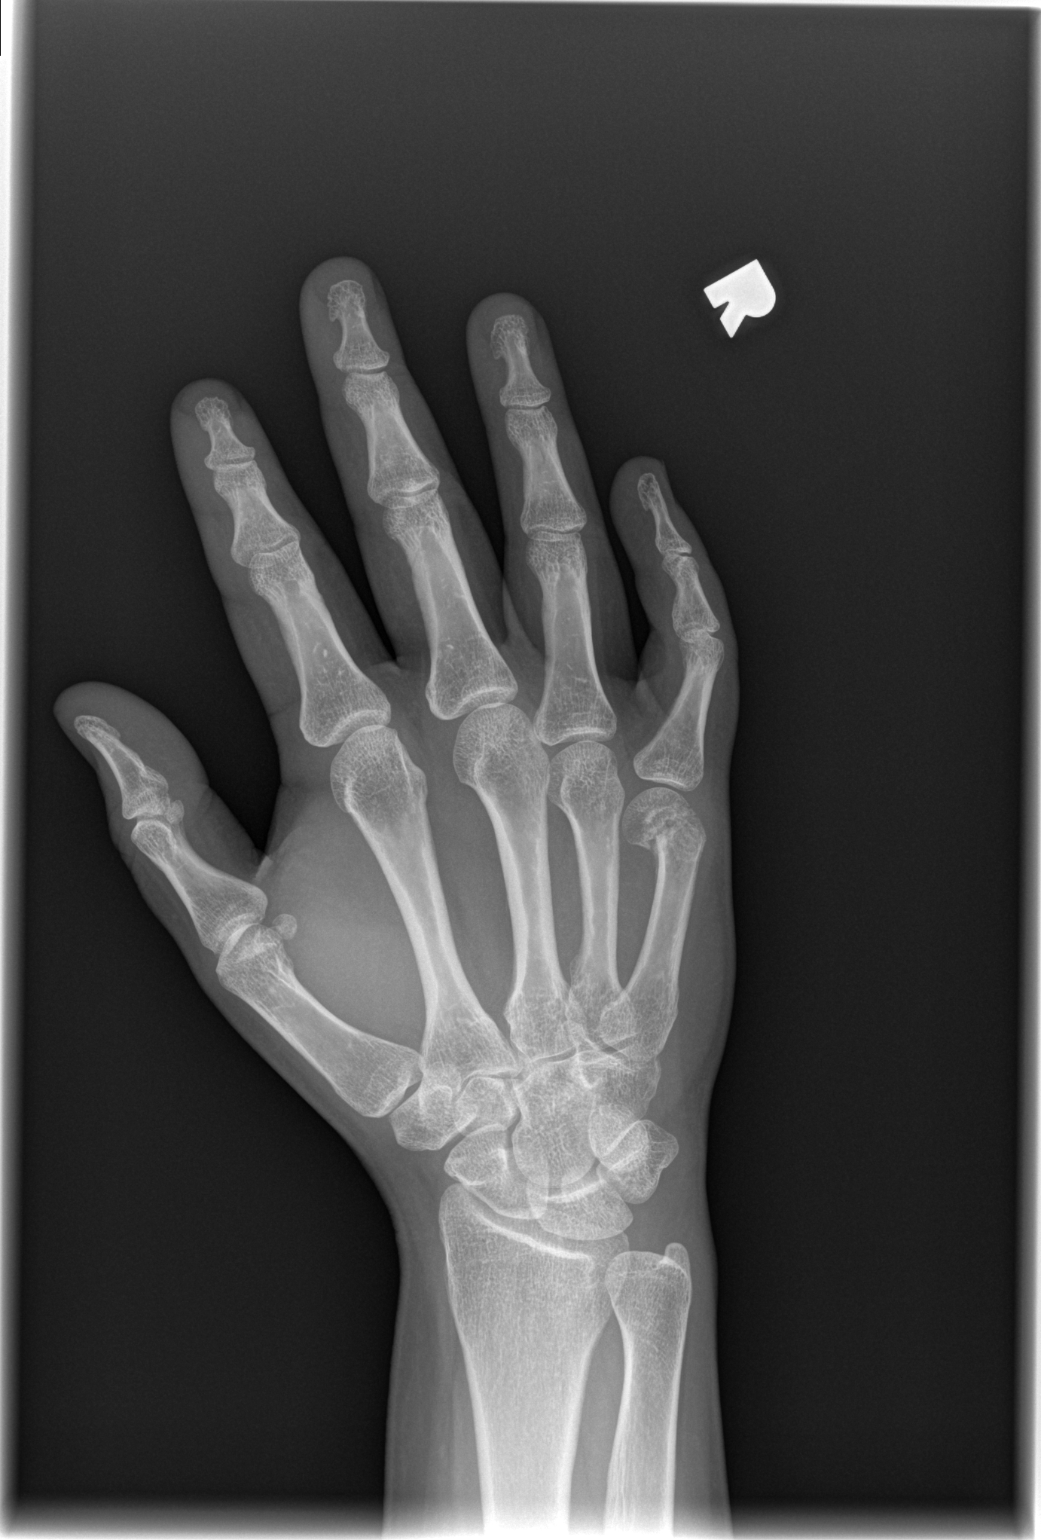

[x hand lat right]
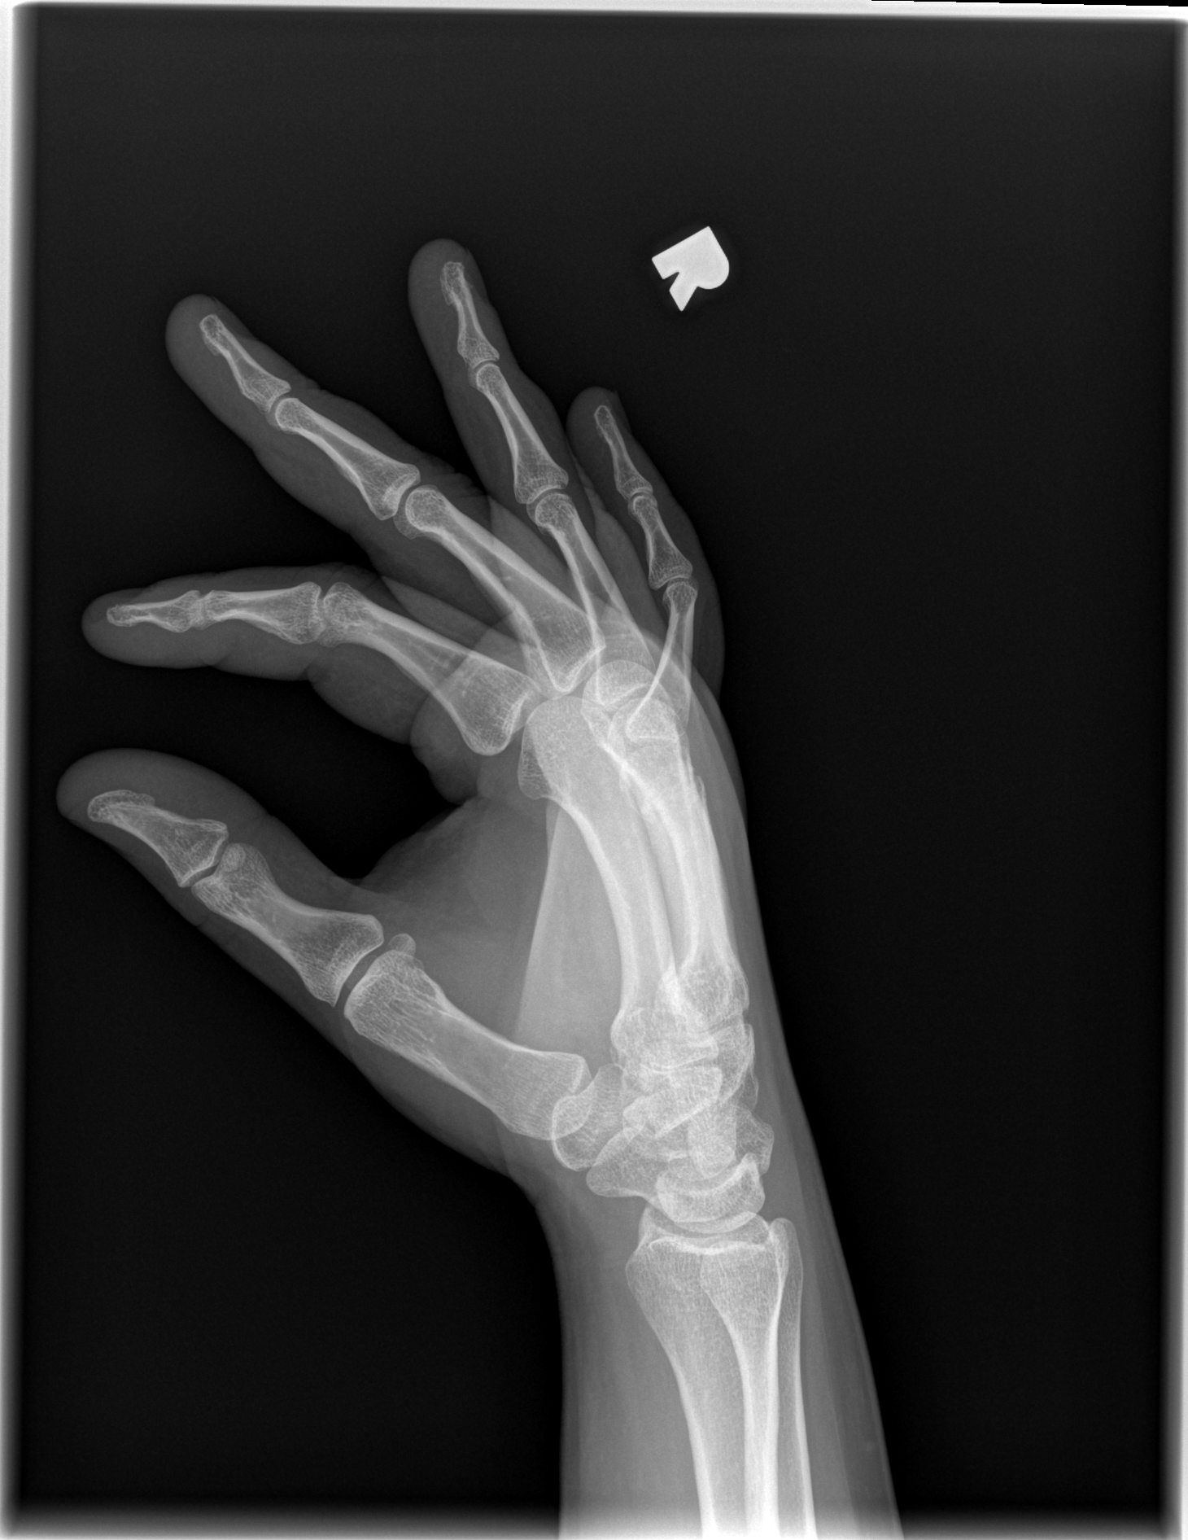

[3 of 3 positions shown; findings below may reference images not displayed]

FINDINGS: Fracture of the fifth metacarpal neck is seen with blurring of the
fracture lines. Persistent apex dorsal angulation. No additional
evidence of an acute fracture.
IMPRESSION: Healing fifth metacarpal neck fracture.

## 2022-06-16 ENCOUNTER — Encounter: Payer: Self-pay | Admitting: *Deleted

## 2023-03-26 ENCOUNTER — Other Ambulatory Visit (HOSPITAL_COMMUNITY): Payer: Self-pay | Admitting: Nurse Practitioner

## 2023-03-26 DIAGNOSIS — E063 Autoimmune thyroiditis: Secondary | ICD-10-CM

## 2023-03-26 DIAGNOSIS — R946 Abnormal results of thyroid function studies: Secondary | ICD-10-CM

## 2023-03-26 DIAGNOSIS — E039 Hypothyroidism, unspecified: Secondary | ICD-10-CM

## 2023-03-26 DIAGNOSIS — E041 Nontoxic single thyroid nodule: Secondary | ICD-10-CM

## 2023-03-26 DIAGNOSIS — Z85850 Personal history of malignant neoplasm of thyroid: Secondary | ICD-10-CM

## 2023-04-17 ENCOUNTER — Ambulatory Visit (HOSPITAL_COMMUNITY): Payer: No Typology Code available for payment source

## 2023-04-17 ENCOUNTER — Ambulatory Visit (HOSPITAL_COMMUNITY)
Admission: RE | Admit: 2023-04-17 | Discharge: 2023-04-17 | Disposition: A | Discharge status: Home / Self Care | Payer: BC Managed Care – PPO | Source: Ambulatory Visit | Attending: Nurse Practitioner | Admitting: Nurse Practitioner

## 2023-04-17 DIAGNOSIS — E039 Hypothyroidism, unspecified: Secondary | ICD-10-CM | POA: Insufficient documentation

## 2023-04-17 DIAGNOSIS — R946 Abnormal results of thyroid function studies: Secondary | ICD-10-CM | POA: Diagnosis present

## 2023-04-17 DIAGNOSIS — E041 Nontoxic single thyroid nodule: Secondary | ICD-10-CM | POA: Diagnosis present

## 2023-04-17 DIAGNOSIS — Z85850 Personal history of malignant neoplasm of thyroid: Secondary | ICD-10-CM | POA: Insufficient documentation

## 2023-04-17 DIAGNOSIS — E063 Autoimmune thyroiditis: Secondary | ICD-10-CM | POA: Diagnosis present
